# Patient Record
Sex: Female | Born: 1935 | Race: White | Hispanic: No | Marital: Married | Smoking: Never smoker
Health system: Southern US, Community
[De-identification: ages and names within clinical notes are randomized; demographics above are authoritative.]

## PROBLEM LIST (undated history)

## (undated) DIAGNOSIS — E785 Hyperlipidemia, unspecified: Secondary | ICD-10-CM

## (undated) DIAGNOSIS — D649 Anemia, unspecified: Secondary | ICD-10-CM

## (undated) DIAGNOSIS — I4891 Unspecified atrial fibrillation: Secondary | ICD-10-CM

## (undated) DIAGNOSIS — C50919 Malignant neoplasm of unspecified site of unspecified female breast: Secondary | ICD-10-CM

## (undated) DIAGNOSIS — E611 Iron deficiency: Secondary | ICD-10-CM

## (undated) DIAGNOSIS — I219 Acute myocardial infarction, unspecified: Secondary | ICD-10-CM

## (undated) DIAGNOSIS — I10 Essential (primary) hypertension: Secondary | ICD-10-CM

## (undated) DIAGNOSIS — I639 Cerebral infarction, unspecified: Secondary | ICD-10-CM

## (undated) DIAGNOSIS — Z90710 Acquired absence of both cervix and uterus: Secondary | ICD-10-CM

## (undated) DIAGNOSIS — F419 Anxiety disorder, unspecified: Secondary | ICD-10-CM

## (undated) HISTORY — PX: TOTAL KNEE ARTHROPLASTY: SHX125

## (undated) HISTORY — PX: MASTECTOMY: SHX3

## (undated) HISTORY — DX: Acquired absence of both cervix and uterus: Z90.710

## (undated) HISTORY — DX: Essential (primary) hypertension: I10

## (undated) HISTORY — PX: EYE SURGERY: SHX253

## (undated) HISTORY — DX: Acute myocardial infarction, unspecified: I21.9

## (undated) HISTORY — DX: Cerebral infarction, unspecified: I63.9

## (undated) HISTORY — DX: Hyperlipidemia, unspecified: E78.5

## (undated) HISTORY — DX: Anxiety disorder, unspecified: F41.9

## (undated) HISTORY — DX: Malignant neoplasm of unspecified site of unspecified female breast: C50.919

## (undated) HISTORY — PX: CHOLECYSTECTOMY: SHX55

## (undated) HISTORY — DX: Unspecified atrial fibrillation: I48.91

## (undated) HISTORY — DX: Anemia, unspecified: D64.9

## (undated) HISTORY — DX: Iron deficiency: E61.1

---

## 1997-08-17 ENCOUNTER — Other Ambulatory Visit: Admission: RE | Admit: 1997-08-17 | Discharge: 1997-08-17 | Payer: Self-pay | Admitting: Obstetrics and Gynecology

## 1997-12-29 ENCOUNTER — Encounter: Payer: Self-pay | Admitting: Internal Medicine

## 1997-12-29 ENCOUNTER — Ambulatory Visit (HOSPITAL_COMMUNITY): Admission: RE | Admit: 1997-12-29 | Discharge: 1997-12-29 | Payer: Self-pay | Admitting: Internal Medicine

## 1998-10-24 ENCOUNTER — Other Ambulatory Visit: Admission: RE | Admit: 1998-10-24 | Discharge: 1998-10-24 | Payer: Self-pay | Admitting: Obstetrics and Gynecology

## 1999-01-01 ENCOUNTER — Encounter: Admission: RE | Admit: 1999-01-01 | Discharge: 1999-01-01 | Payer: Self-pay | Admitting: Obstetrics and Gynecology

## 1999-01-01 ENCOUNTER — Encounter: Payer: Self-pay | Admitting: Obstetrics and Gynecology

## 1999-01-10 ENCOUNTER — Encounter: Admission: RE | Admit: 1999-01-10 | Discharge: 1999-01-10 | Payer: Self-pay | Admitting: Obstetrics and Gynecology

## 1999-01-10 ENCOUNTER — Encounter: Payer: Self-pay | Admitting: Obstetrics and Gynecology

## 1999-01-15 ENCOUNTER — Ambulatory Visit (HOSPITAL_COMMUNITY): Admission: RE | Admit: 1999-01-15 | Discharge: 1999-01-15 | Payer: Self-pay | Admitting: Obstetrics and Gynecology

## 1999-01-15 ENCOUNTER — Encounter: Payer: Self-pay | Admitting: Obstetrics and Gynecology

## 1999-01-23 ENCOUNTER — Encounter (INDEPENDENT_AMBULATORY_CARE_PROVIDER_SITE_OTHER): Payer: Self-pay | Admitting: Specialist

## 1999-01-23 ENCOUNTER — Ambulatory Visit (HOSPITAL_COMMUNITY): Admission: RE | Admit: 1999-01-23 | Discharge: 1999-01-23 | Payer: Self-pay | Admitting: Internal Medicine

## 1999-01-23 ENCOUNTER — Encounter: Payer: Self-pay | Admitting: Internal Medicine

## 1999-02-15 ENCOUNTER — Encounter: Payer: Self-pay | Admitting: General Surgery

## 1999-02-19 ENCOUNTER — Encounter (INDEPENDENT_AMBULATORY_CARE_PROVIDER_SITE_OTHER): Payer: Self-pay | Admitting: Specialist

## 1999-02-19 ENCOUNTER — Ambulatory Visit (HOSPITAL_COMMUNITY): Admission: RE | Admit: 1999-02-19 | Discharge: 1999-02-20 | Payer: Self-pay | Admitting: General Surgery

## 1999-02-19 ENCOUNTER — Encounter: Payer: Self-pay | Admitting: General Surgery

## 1999-06-26 ENCOUNTER — Encounter: Payer: Self-pay | Admitting: General Surgery

## 1999-06-26 ENCOUNTER — Encounter: Admission: RE | Admit: 1999-06-26 | Discharge: 1999-06-26 | Payer: Self-pay | Admitting: General Surgery

## 1999-09-04 ENCOUNTER — Encounter: Payer: Self-pay | Admitting: General Surgery

## 1999-09-07 ENCOUNTER — Encounter (INDEPENDENT_AMBULATORY_CARE_PROVIDER_SITE_OTHER): Payer: Self-pay | Admitting: Specialist

## 1999-09-07 ENCOUNTER — Ambulatory Visit (HOSPITAL_COMMUNITY): Admission: RE | Admit: 1999-09-07 | Discharge: 1999-09-07 | Payer: Self-pay | Admitting: General Surgery

## 2000-01-09 ENCOUNTER — Other Ambulatory Visit: Admission: RE | Admit: 2000-01-09 | Discharge: 2000-01-09 | Payer: Self-pay | Admitting: Obstetrics and Gynecology

## 2000-06-26 ENCOUNTER — Encounter: Admission: RE | Admit: 2000-06-26 | Discharge: 2000-06-26 | Payer: Self-pay | Admitting: General Surgery

## 2000-06-26 ENCOUNTER — Encounter: Payer: Self-pay | Admitting: General Surgery

## 2001-01-21 ENCOUNTER — Other Ambulatory Visit: Admission: RE | Admit: 2001-01-21 | Discharge: 2001-01-21 | Payer: Self-pay | Admitting: Obstetrics and Gynecology

## 2001-06-30 ENCOUNTER — Encounter: Admission: RE | Admit: 2001-06-30 | Discharge: 2001-06-30 | Payer: Self-pay | Admitting: Oncology

## 2001-06-30 ENCOUNTER — Encounter: Payer: Self-pay | Admitting: Oncology

## 2001-07-21 ENCOUNTER — Encounter: Payer: Self-pay | Admitting: Internal Medicine

## 2001-07-21 ENCOUNTER — Encounter: Admission: RE | Admit: 2001-07-21 | Discharge: 2001-07-21 | Payer: Self-pay | Admitting: Internal Medicine

## 2001-08-24 ENCOUNTER — Encounter: Payer: Self-pay | Admitting: Emergency Medicine

## 2001-08-24 ENCOUNTER — Encounter: Payer: Self-pay | Admitting: Cardiovascular Disease

## 2001-08-24 ENCOUNTER — Inpatient Hospital Stay (HOSPITAL_COMMUNITY): Admission: EM | Admit: 2001-08-24 | Discharge: 2001-08-25 | Payer: Self-pay | Admitting: Emergency Medicine

## 2002-02-02 ENCOUNTER — Encounter: Admission: RE | Admit: 2002-02-02 | Discharge: 2002-02-02 | Payer: Self-pay | Admitting: Internal Medicine

## 2002-02-02 ENCOUNTER — Encounter: Payer: Self-pay | Admitting: Internal Medicine

## 2002-07-05 ENCOUNTER — Encounter: Admission: RE | Admit: 2002-07-05 | Discharge: 2002-07-05 | Payer: Self-pay | Admitting: Oncology

## 2002-07-05 ENCOUNTER — Encounter: Payer: Self-pay | Admitting: Oncology

## 2002-11-29 DIAGNOSIS — K5730 Diverticulosis of large intestine without perforation or abscess without bleeding: Secondary | ICD-10-CM | POA: Insufficient documentation

## 2003-01-25 ENCOUNTER — Other Ambulatory Visit: Admission: RE | Admit: 2003-01-25 | Discharge: 2003-01-25 | Payer: Self-pay | Admitting: Internal Medicine

## 2003-07-06 ENCOUNTER — Encounter: Admission: RE | Admit: 2003-07-06 | Discharge: 2003-07-06 | Payer: Self-pay | Admitting: Oncology

## 2003-08-29 ENCOUNTER — Inpatient Hospital Stay (HOSPITAL_COMMUNITY): Admission: RE | Admit: 2003-08-29 | Discharge: 2003-09-02 | Payer: Self-pay | Admitting: Orthopedic Surgery

## 2004-02-16 ENCOUNTER — Encounter: Admission: RE | Admit: 2004-02-16 | Discharge: 2004-02-16 | Payer: Self-pay | Admitting: Oncology

## 2004-02-22 ENCOUNTER — Ambulatory Visit: Payer: Self-pay | Admitting: Oncology

## 2004-07-10 ENCOUNTER — Encounter: Admission: RE | Admit: 2004-07-10 | Discharge: 2004-07-10 | Payer: Self-pay | Admitting: Oncology

## 2004-08-21 ENCOUNTER — Ambulatory Visit: Payer: Self-pay | Admitting: Oncology

## 2005-01-31 ENCOUNTER — Encounter: Admission: RE | Admit: 2005-01-31 | Discharge: 2005-01-31 | Payer: Self-pay | Admitting: Internal Medicine

## 2005-02-20 ENCOUNTER — Ambulatory Visit: Payer: Self-pay | Admitting: Oncology

## 2005-07-11 ENCOUNTER — Encounter: Admission: RE | Admit: 2005-07-11 | Discharge: 2005-07-11 | Payer: Self-pay | Admitting: Oncology

## 2005-08-27 ENCOUNTER — Ambulatory Visit: Payer: Self-pay | Admitting: Oncology

## 2005-08-27 LAB — CBC WITH DIFFERENTIAL/PLATELET
Eosinophils Absolute: 0.4 10*3/uL (ref 0.0–0.5)
HCT: 35.2 % (ref 34.8–46.6)
LYMPH%: 24.7 % (ref 14.0–48.0)
MONO#: 0.6 10*3/uL (ref 0.1–0.9)
NEUT#: 5.1 10*3/uL (ref 1.5–6.5)
NEUT%: 62.6 % (ref 39.6–76.8)
Platelets: 282 10*3/uL (ref 145–400)
RBC: 3.93 10*6/uL (ref 3.70–5.32)
WBC: 8.2 10*3/uL (ref 3.9–10.0)

## 2005-08-27 LAB — CANCER ANTIGEN 27.29: CA 27.29: 11 U/mL (ref 0–39)

## 2005-08-27 LAB — COMPREHENSIVE METABOLIC PANEL
CO2: 29 mEq/L (ref 19–32)
Calcium: 10.2 mg/dL (ref 8.4–10.5)
Glucose, Bld: 90 mg/dL (ref 70–99)
Sodium: 137 mEq/L (ref 135–145)
Total Bilirubin: 0.6 mg/dL (ref 0.3–1.2)
Total Protein: 7 g/dL (ref 6.0–8.3)

## 2005-08-27 LAB — LACTATE DEHYDROGENASE: LDH: 197 U/L (ref 94–250)

## 2006-02-06 ENCOUNTER — Other Ambulatory Visit: Admission: RE | Admit: 2006-02-06 | Discharge: 2006-02-06 | Payer: Self-pay | Admitting: Internal Medicine

## 2006-02-06 LAB — HM PAP SMEAR

## 2006-02-18 ENCOUNTER — Encounter: Admission: RE | Admit: 2006-02-18 | Discharge: 2006-02-18 | Payer: Self-pay | Admitting: Oncology

## 2006-02-25 ENCOUNTER — Encounter: Admission: RE | Admit: 2006-02-25 | Discharge: 2006-02-25 | Payer: Self-pay | Admitting: Internal Medicine

## 2006-03-13 ENCOUNTER — Encounter: Admission: RE | Admit: 2006-03-13 | Discharge: 2006-04-10 | Payer: Self-pay | Admitting: Internal Medicine

## 2006-04-10 ENCOUNTER — Encounter: Admission: RE | Admit: 2006-04-10 | Discharge: 2006-04-10 | Payer: Self-pay | Admitting: Internal Medicine

## 2006-04-11 ENCOUNTER — Inpatient Hospital Stay (HOSPITAL_COMMUNITY): Admission: EM | Admit: 2006-04-11 | Discharge: 2006-04-14 | Payer: Self-pay | Admitting: Emergency Medicine

## 2006-04-18 ENCOUNTER — Emergency Department (HOSPITAL_COMMUNITY): Admission: EM | Admit: 2006-04-18 | Discharge: 2006-04-18 | Payer: Self-pay | Admitting: Emergency Medicine

## 2006-05-01 ENCOUNTER — Encounter: Admission: RE | Admit: 2006-05-01 | Discharge: 2006-05-01 | Payer: Self-pay | Admitting: Neurosurgery

## 2006-05-16 ENCOUNTER — Encounter: Admission: RE | Admit: 2006-05-16 | Discharge: 2006-05-16 | Payer: Self-pay | Admitting: Neurosurgery

## 2006-07-08 ENCOUNTER — Encounter: Admission: RE | Admit: 2006-07-08 | Discharge: 2006-07-08 | Payer: Self-pay | Admitting: Internal Medicine

## 2006-07-14 ENCOUNTER — Encounter: Admission: RE | Admit: 2006-07-14 | Discharge: 2006-07-14 | Payer: Self-pay | Admitting: Oncology

## 2006-07-16 ENCOUNTER — Ambulatory Visit: Payer: Self-pay | Admitting: Vascular Surgery

## 2006-07-16 ENCOUNTER — Ambulatory Visit (HOSPITAL_COMMUNITY): Admission: RE | Admit: 2006-07-16 | Discharge: 2006-07-16 | Payer: Self-pay | Admitting: Internal Medicine

## 2006-08-13 ENCOUNTER — Ambulatory Visit: Payer: Self-pay | Admitting: Internal Medicine

## 2006-08-14 ENCOUNTER — Ambulatory Visit (HOSPITAL_COMMUNITY): Admission: RE | Admit: 2006-08-14 | Discharge: 2006-08-14 | Payer: Self-pay | Admitting: Neurosurgery

## 2006-08-21 ENCOUNTER — Emergency Department (HOSPITAL_COMMUNITY): Admission: EM | Admit: 2006-08-21 | Discharge: 2006-08-21 | Payer: Self-pay | Admitting: Emergency Medicine

## 2006-08-22 ENCOUNTER — Ambulatory Visit: Payer: Self-pay | Admitting: Oncology

## 2006-08-26 LAB — COMPREHENSIVE METABOLIC PANEL
AST: 15 U/L (ref 0–37)
Alkaline Phosphatase: 67 U/L (ref 39–117)
BUN: 12 mg/dL (ref 6–23)
Calcium: 9.5 mg/dL (ref 8.4–10.5)
Chloride: 101 mEq/L (ref 96–112)
Creatinine, Ser: 0.74 mg/dL (ref 0.40–1.20)
Total Bilirubin: 0.5 mg/dL (ref 0.3–1.2)

## 2006-08-26 LAB — CBC WITH DIFFERENTIAL/PLATELET
Basophils Absolute: 0.1 10*3/uL (ref 0.0–0.1)
EOS%: 3.1 % (ref 0.0–7.0)
HCT: 28.8 % — ABNORMAL LOW (ref 34.8–46.6)
HGB: 9.5 g/dL — ABNORMAL LOW (ref 11.6–15.9)
LYMPH%: 19.8 % (ref 14.0–48.0)
MCH: 23.5 pg — ABNORMAL LOW (ref 26.0–34.0)
MCHC: 32.9 g/dL (ref 32.0–36.0)
MCV: 71.4 fL — ABNORMAL LOW (ref 81.0–101.0)
MONO%: 8.1 % (ref 0.0–13.0)
NEUT%: 68.4 % (ref 39.6–76.8)
Platelets: 410 10*3/uL — ABNORMAL HIGH (ref 145–400)

## 2006-08-29 ENCOUNTER — Encounter (INDEPENDENT_AMBULATORY_CARE_PROVIDER_SITE_OTHER): Payer: Self-pay | Admitting: Emergency Medicine

## 2006-08-29 ENCOUNTER — Inpatient Hospital Stay (HOSPITAL_COMMUNITY): Admission: EM | Admit: 2006-08-29 | Discharge: 2006-09-03 | Payer: Self-pay | Admitting: Emergency Medicine

## 2006-09-02 ENCOUNTER — Encounter: Payer: Self-pay | Admitting: Gastroenterology

## 2006-09-02 DIAGNOSIS — D126 Benign neoplasm of colon, unspecified: Secondary | ICD-10-CM | POA: Insufficient documentation

## 2006-09-05 ENCOUNTER — Ambulatory Visit: Payer: Self-pay | Admitting: Internal Medicine

## 2006-09-26 ENCOUNTER — Ambulatory Visit: Payer: Self-pay | Admitting: Internal Medicine

## 2006-09-26 LAB — CONVERTED CEMR LAB
Eosinophils Absolute: 0.4 10*3/uL (ref 0.0–0.6)
Iron: 19 ug/dL — ABNORMAL LOW (ref 42–145)
Lymphocytes Relative: 19.4 % (ref 12.0–46.0)
MCV: 72.8 fL — ABNORMAL LOW (ref 78.0–100.0)
Monocytes Relative: 8.2 % (ref 3.0–11.0)
Neutro Abs: 5.2 10*3/uL (ref 1.4–7.7)
Platelets: 394 10*3/uL (ref 150–400)
Transferrin: 214.9 mg/dL (ref >=212.0)

## 2006-10-16 ENCOUNTER — Encounter (HOSPITAL_COMMUNITY): Admission: RE | Admit: 2006-10-16 | Discharge: 2007-01-14 | Payer: Self-pay | Admitting: Internal Medicine

## 2006-12-03 ENCOUNTER — Ambulatory Visit: Payer: Self-pay | Admitting: Internal Medicine

## 2006-12-03 LAB — CONVERTED CEMR LAB
Basophils Absolute: 0.1 10*3/uL (ref 0.0–0.1)
Eosinophils Absolute: 0.2 10*3/uL (ref 0.0–0.6)
Hemoglobin: 10.5 g/dL — ABNORMAL LOW (ref 12.0–15.0)
Iron: 23 ug/dL — ABNORMAL LOW (ref 42–145)
MCHC: 32.9 g/dL (ref 30.0–36.0)
MCV: 75.9 fL — ABNORMAL LOW (ref 78.0–100.0)
Monocytes Absolute: 0.4 10*3/uL (ref 0.2–0.7)
Monocytes Relative: 4.9 % (ref 3.0–11.0)
RBC: 4.22 M/uL (ref 3.87–5.11)
RDW: 19.8 % — ABNORMAL HIGH (ref 11.5–14.6)

## 2006-12-12 ENCOUNTER — Ambulatory Visit (HOSPITAL_COMMUNITY): Admission: RE | Admit: 2006-12-12 | Discharge: 2006-12-12 | Payer: Self-pay | Admitting: Internal Medicine

## 2006-12-12 ENCOUNTER — Encounter: Payer: Self-pay | Admitting: Internal Medicine

## 2006-12-17 ENCOUNTER — Ambulatory Visit: Payer: Self-pay | Admitting: Internal Medicine

## 2007-01-05 ENCOUNTER — Ambulatory Visit: Payer: Self-pay | Admitting: Internal Medicine

## 2007-01-05 LAB — CONVERTED CEMR LAB
Basophils Relative: 0.1 % (ref 0.0–1.0)
HCT: 31.1 % — ABNORMAL LOW (ref 36.0–46.0)
Hemoglobin: 10.4 g/dL — ABNORMAL LOW (ref 12.0–15.0)
Lymphocytes Relative: 16.1 % (ref 12.0–46.0)
MCHC: 33.4 g/dL (ref 30.0–36.0)
Monocytes Absolute: 0.6 10*3/uL (ref 0.2–0.7)
Neutrophils Relative %: 73.3 % (ref 43.0–77.0)
RDW: 20.6 % — ABNORMAL HIGH (ref 11.5–14.6)
WBC: 7.2 10*3/uL (ref 4.5–10.5)

## 2007-01-06 ENCOUNTER — Ambulatory Visit: Payer: Self-pay | Admitting: Oncology

## 2007-01-12 LAB — CBC & DIFF AND RETIC
BASO%: 0.4 % (ref 0.0–2.0)
EOS%: 2.6 % (ref 0.0–7.0)
HCT: 29.8 % — ABNORMAL LOW (ref 34.8–46.6)
LYMPH%: 14.7 % (ref 14.0–48.0)
MCH: 27 pg (ref 26.0–34.0)
MCHC: 34 g/dL (ref 32.0–36.0)
MONO%: 7 % (ref 0.0–13.0)
NEUT%: 75.3 % (ref 39.6–76.8)
Platelets: 294 10*3/uL (ref 145–400)

## 2007-01-12 LAB — MORPHOLOGY

## 2007-01-12 LAB — CHCC SMEAR

## 2007-01-15 LAB — PROTEIN ELECTROPHORESIS, SERUM: Total Protein, Serum Electrophoresis: 7.6 g/dL (ref 6.0–8.3)

## 2007-01-15 LAB — FERRITIN: Ferritin: 1361 ng/mL — ABNORMAL HIGH (ref 10–291)

## 2007-01-15 LAB — VITAMIN B12: Vitamin B-12: 802 pg/mL (ref 211–911)

## 2007-01-15 LAB — IRON AND TIBC: %SAT: 7 % — ABNORMAL LOW (ref 20–55)

## 2007-01-22 ENCOUNTER — Ambulatory Visit: Payer: Self-pay | Admitting: Internal Medicine

## 2007-01-27 LAB — CBC WITH DIFFERENTIAL/PLATELET
BASO%: 0.5 % (ref 0.0–2.0)
Eosinophils Absolute: 0.2 10*3/uL (ref 0.0–0.5)
MCHC: 33.4 g/dL (ref 32.0–36.0)
MCV: 80.4 fL — ABNORMAL LOW (ref 81.0–101.0)
MONO%: 7.2 % (ref 0.0–13.0)
NEUT#: 5.6 10*3/uL (ref 1.5–6.5)
RBC: 3.64 10*6/uL — ABNORMAL LOW (ref 3.70–5.32)
RDW: 18.8 % — ABNORMAL HIGH (ref 11.3–14.5)
WBC: 7.5 10*3/uL (ref 3.9–10.0)

## 2007-01-27 LAB — CHCC SMEAR

## 2007-01-28 ENCOUNTER — Ambulatory Visit (HOSPITAL_COMMUNITY): Admission: RE | Admit: 2007-01-28 | Discharge: 2007-01-28 | Payer: Self-pay | Admitting: Oncology

## 2007-02-09 DIAGNOSIS — I10 Essential (primary) hypertension: Secondary | ICD-10-CM

## 2007-02-09 DIAGNOSIS — D649 Anemia, unspecified: Secondary | ICD-10-CM

## 2007-02-09 DIAGNOSIS — I480 Paroxysmal atrial fibrillation: Secondary | ICD-10-CM | POA: Insufficient documentation

## 2007-02-09 DIAGNOSIS — F411 Generalized anxiety disorder: Secondary | ICD-10-CM | POA: Insufficient documentation

## 2007-02-09 DIAGNOSIS — Z853 Personal history of malignant neoplasm of breast: Secondary | ICD-10-CM

## 2007-02-09 DIAGNOSIS — Z8673 Personal history of transient ischemic attack (TIA), and cerebral infarction without residual deficits: Secondary | ICD-10-CM | POA: Insufficient documentation

## 2007-02-16 ENCOUNTER — Ambulatory Visit: Payer: Self-pay | Admitting: Oncology

## 2007-02-17 ENCOUNTER — Ambulatory Visit (HOSPITAL_COMMUNITY): Admission: RE | Admit: 2007-02-17 | Discharge: 2007-02-17 | Payer: Self-pay | Admitting: Oncology

## 2007-02-18 LAB — CBC & DIFF AND RETIC
BASO%: 1.3 % (ref 0.0–2.0)
EOS%: 3.8 % (ref 0.0–7.0)
HCT: 32.1 % — ABNORMAL LOW (ref 34.8–46.6)
IRF: 0.2 (ref 0.130–0.330)
MCHC: 33 g/dL (ref 32.0–36.0)
MONO#: 0.5 10*3/uL (ref 0.1–0.9)
NEUT%: 76.4 % (ref 39.6–76.8)
RBC: 3.96 10*6/uL (ref 3.70–5.32)
RDW: 17.4 % — ABNORMAL HIGH (ref 11.3–14.5)
RETIC #: 40.8 10*3/uL (ref 19.7–115.1)
Retic %: 1 % (ref 0.4–2.3)
WBC: 7.4 10*3/uL (ref 3.9–10.0)
lymph#: 0.8 10*3/uL — ABNORMAL LOW (ref 0.9–3.3)

## 2007-02-18 LAB — MORPHOLOGY: RBC Comments: NORMAL

## 2007-03-03 ENCOUNTER — Ambulatory Visit: Payer: Self-pay | Admitting: Internal Medicine

## 2007-03-03 LAB — CONVERTED CEMR LAB
ALT: 23 units/L (ref 0–35)
Basophils Relative: 0.7 % (ref 0.0–1.0)
Bilirubin, Direct: 0.1 mg/dL (ref 0.0–0.3)
CA 125: 12.9 units/mL (ref 0.0–30.2)
CO2: 26 meq/L (ref 19–32)
Calcium: 10.2 mg/dL (ref 8.4–10.5)
Eosinophils Relative: 0.9 % (ref 0.0–5.0)
GFR calc Af Amer: 77 mL/min
Glucose, Bld: 81 mg/dL (ref 70–99)
HCT: 37.5 % (ref 36.0–46.0)
Hemoglobin: 12.8 g/dL (ref 12.0–15.0)
Lymphocytes Relative: 6.6 % — ABNORMAL LOW (ref 12.0–46.0)
Monocytes Absolute: 0.8 10*3/uL — ABNORMAL HIGH (ref 0.2–0.7)
Neutro Abs: 11.6 10*3/uL — ABNORMAL HIGH (ref 1.4–7.7)
Neutrophils Relative %: 86 % — ABNORMAL HIGH (ref 43.0–77.0)
Potassium: 5.1 meq/L (ref 3.5–5.1)
Saturation Ratios: 8.7 % — ABNORMAL LOW (ref 20.0–50.0)
Sodium: 133 meq/L — ABNORMAL LOW (ref 135–145)
Total Protein: 7.7 g/dL (ref 6.0–8.3)
WBC: 13.5 10*3/uL — ABNORMAL HIGH (ref 4.5–10.5)

## 2007-03-11 LAB — CBC WITH DIFFERENTIAL/PLATELET
EOS%: 1.2 % (ref 0.0–7.0)
HGB: 12.9 g/dL (ref 11.6–15.9)
MCH: 25.6 pg — ABNORMAL LOW (ref 26.0–34.0)
MCV: 79.6 fL — ABNORMAL LOW (ref 81.0–101.0)
MONO%: 8.5 % (ref 0.0–13.0)
NEUT#: 7.7 10*3/uL — ABNORMAL HIGH (ref 1.5–6.5)
RBC: 5.06 10*6/uL (ref 3.70–5.32)
RDW: 18.1 % — ABNORMAL HIGH (ref 11.3–14.5)
lymph#: 1.1 10*3/uL (ref 0.9–3.3)

## 2007-03-19 ENCOUNTER — Ambulatory Visit: Payer: Self-pay | Admitting: Oncology

## 2007-03-19 ENCOUNTER — Inpatient Hospital Stay (HOSPITAL_COMMUNITY): Admission: EM | Admit: 2007-03-19 | Discharge: 2007-03-27 | Payer: Self-pay | Admitting: Emergency Medicine

## 2007-03-24 ENCOUNTER — Encounter (INDEPENDENT_AMBULATORY_CARE_PROVIDER_SITE_OTHER): Payer: Self-pay | Admitting: *Deleted

## 2007-03-26 ENCOUNTER — Ambulatory Visit: Payer: Self-pay | Admitting: Physical Medicine & Rehabilitation

## 2007-03-27 ENCOUNTER — Ambulatory Visit: Payer: Self-pay | Admitting: Physical Medicine & Rehabilitation

## 2007-03-27 ENCOUNTER — Inpatient Hospital Stay (HOSPITAL_COMMUNITY)
Admission: AD | Admit: 2007-03-27 | Discharge: 2007-04-02 | Payer: Self-pay | Admitting: Physical Medicine & Rehabilitation

## 2007-04-20 ENCOUNTER — Ambulatory Visit: Payer: Self-pay | Admitting: Oncology

## 2007-04-22 LAB — CBC WITH DIFFERENTIAL/PLATELET
Eosinophils Absolute: 0.3 10*3/uL (ref 0.0–0.5)
MONO#: 0.6 10*3/uL (ref 0.1–0.9)
NEUT#: 5.2 10*3/uL (ref 1.5–6.5)
RBC: 4.24 10*6/uL (ref 3.70–5.32)
RDW: 20.6 % — ABNORMAL HIGH (ref 11.3–14.5)
WBC: 7.3 10*3/uL (ref 3.9–10.0)
lymph#: 1.2 10*3/uL (ref 0.9–3.3)

## 2007-05-13 LAB — CBC WITH DIFFERENTIAL/PLATELET
Basophils Absolute: 0.1 10*3/uL (ref 0.0–0.1)
Eosinophils Absolute: 0.1 10*3/uL (ref 0.0–0.5)
HCT: 44.9 % (ref 34.8–46.6)
HGB: 14.5 g/dL (ref 11.6–15.9)
MCH: 27 pg (ref 26.0–34.0)
MONO#: 0.8 10*3/uL (ref 0.1–0.9)
NEUT#: 8.1 10*3/uL — ABNORMAL HIGH (ref 1.5–6.5)
NEUT%: 71.4 % (ref 39.6–76.8)
RDW: 19.4 % — ABNORMAL HIGH (ref 11.3–14.5)
lymph#: 2.2 10*3/uL (ref 0.9–3.3)

## 2007-06-02 ENCOUNTER — Ambulatory Visit: Payer: Self-pay | Admitting: Oncology

## 2007-06-04 LAB — MORPHOLOGY: PLT EST: ADEQUATE

## 2007-06-04 LAB — CBC & DIFF AND RETIC
Eosinophils Absolute: 0.3 10*3/uL (ref 0.0–0.5)
IRF: 0.31 (ref 0.130–0.330)
MCV: 81.4 fL (ref 81.0–101.0)
MONO%: 8.4 % (ref 0.0–13.0)
NEUT#: 5.8 10*3/uL (ref 1.5–6.5)
RBC: 4.03 10*6/uL (ref 3.70–5.32)
RDW: 19.2 % — ABNORMAL HIGH (ref 11.3–14.5)
RETIC #: 49.6 10*3/uL (ref 19.7–115.1)
Retic %: 1.2 % (ref 0.4–2.3)
WBC: 7.9 10*3/uL (ref 3.9–10.0)
lymph#: 1.1 10*3/uL (ref 0.9–3.3)

## 2007-06-04 LAB — COMPREHENSIVE METABOLIC PANEL
AST: 19 U/L (ref 0–37)
Alkaline Phosphatase: 77 U/L (ref 39–117)
BUN: 13 mg/dL (ref 6–23)
Creatinine, Ser: 0.81 mg/dL (ref 0.40–1.20)
Potassium: 3.9 mEq/L (ref 3.5–5.3)

## 2007-06-04 LAB — CHCC SMEAR

## 2007-06-04 LAB — IRON AND TIBC
%SAT: 7 % — ABNORMAL LOW (ref 20–55)
TIBC: 253 ug/dL (ref 250–470)

## 2007-06-24 LAB — CBC WITH DIFFERENTIAL/PLATELET
BASO%: 1.3 % (ref 0.0–2.0)
EOS%: 5.6 % (ref 0.0–7.0)
HCT: 38.6 % (ref 34.8–46.6)
HGB: 12.5 g/dL (ref 11.6–15.9)
MCH: 27.5 pg (ref 26.0–34.0)
MCHC: 32.4 g/dL (ref 32.0–36.0)
MONO#: 0.6 10*3/uL (ref 0.1–0.9)
NEUT%: 68 % (ref 39.6–76.8)
RDW: 16 % — ABNORMAL HIGH (ref 11.3–14.5)
WBC: 8.2 10*3/uL (ref 3.9–10.0)
lymph#: 1.5 10*3/uL (ref 0.9–3.3)

## 2007-07-01 ENCOUNTER — Encounter: Payer: Self-pay | Admitting: Internal Medicine

## 2007-07-20 ENCOUNTER — Encounter: Admission: RE | Admit: 2007-07-20 | Discharge: 2007-07-20 | Payer: Self-pay | Admitting: Internal Medicine

## 2007-08-03 ENCOUNTER — Ambulatory Visit: Payer: Self-pay | Admitting: Oncology

## 2007-08-05 LAB — CBC WITH DIFFERENTIAL/PLATELET
BASO%: 1 % (ref 0.0–2.0)
EOS%: 4.3 % (ref 0.0–7.0)
MCH: 27.8 pg (ref 26.0–34.0)
MCHC: 33 g/dL (ref 32.0–36.0)
MONO#: 0.9 10*3/uL (ref 0.1–0.9)
NEUT%: 65.3 % (ref 39.6–76.8)
RBC: 4.55 10*6/uL (ref 3.70–5.32)
WBC: 9.6 10*3/uL (ref 3.9–10.0)
lymph#: 1.9 10*3/uL (ref 0.9–3.3)

## 2007-09-02 LAB — CBC WITH DIFFERENTIAL/PLATELET
BASO%: 1.4 % (ref 0.0–2.0)
EOS%: 6.6 % (ref 0.0–7.0)
HCT: 37.1 % (ref 34.8–46.6)
LYMPH%: 16.6 % (ref 14.0–48.0)
MCH: 27.6 pg (ref 26.0–34.0)
MCHC: 33.4 g/dL (ref 32.0–36.0)
MCV: 82.5 fL (ref 81.0–101.0)
MONO%: 9.1 % (ref 0.0–13.0)
NEUT%: 66.3 % (ref 39.6–76.8)
Platelets: 256 10*3/uL (ref 145–400)
RBC: 4.5 10*6/uL (ref 3.70–5.32)
WBC: 8.8 10*3/uL (ref 3.9–10.0)

## 2007-09-27 ENCOUNTER — Ambulatory Visit: Payer: Self-pay | Admitting: Oncology

## 2007-09-30 LAB — CBC WITH DIFFERENTIAL/PLATELET
Basophils Absolute: 0.1 10*3/uL (ref 0.0–0.1)
Eosinophils Absolute: 0.3 10*3/uL (ref 0.0–0.5)
HCT: 36.4 % (ref 34.8–46.6)
LYMPH%: 21.6 % (ref 14.0–48.0)
MCV: 82.8 fL (ref 81.0–101.0)
MONO%: 11.1 % (ref 0.0–13.0)
NEUT#: 5 10*3/uL (ref 1.5–6.5)
NEUT%: 61.9 % (ref 39.6–76.8)
Platelets: 271 10*3/uL (ref 145–400)
RBC: 4.39 10*6/uL (ref 3.70–5.32)

## 2007-10-28 LAB — CBC WITH DIFFERENTIAL/PLATELET
Basophils Absolute: 0.1 10*3/uL (ref 0.0–0.1)
Eosinophils Absolute: 0.2 10*3/uL (ref 0.0–0.5)
HGB: 12.7 g/dL (ref 11.6–15.9)
MCV: 84.1 fL (ref 81.0–101.0)
MONO#: 0.9 10*3/uL (ref 0.1–0.9)
MONO%: 9.7 % (ref 0.0–13.0)
NEUT#: 5.9 10*3/uL (ref 1.5–6.5)
Platelets: 374 10*3/uL (ref 145–400)
RDW: 15 % — ABNORMAL HIGH (ref 11.3–14.5)

## 2007-11-23 ENCOUNTER — Ambulatory Visit: Payer: Self-pay | Admitting: Oncology

## 2007-11-25 LAB — CBC WITH DIFFERENTIAL/PLATELET
BASO%: 1.4 % (ref 0.0–2.0)
LYMPH%: 18.1 % (ref 14.0–48.0)
MCHC: 34.2 g/dL (ref 32.0–36.0)
MONO#: 0.6 10*3/uL (ref 0.1–0.9)
NEUT#: 4.6 10*3/uL (ref 1.5–6.5)
Platelets: 372 10*3/uL (ref 145–400)
RBC: 3.95 10*6/uL (ref 3.70–5.32)
RDW: 14.9 % — ABNORMAL HIGH (ref 11.3–14.5)
WBC: 6.7 10*3/uL (ref 3.9–10.0)

## 2007-12-23 LAB — CBC WITH DIFFERENTIAL/PLATELET
Basophils Absolute: 0.2 10*3/uL — ABNORMAL HIGH (ref 0.0–0.1)
EOS%: 3.1 % (ref 0.0–7.0)
Eosinophils Absolute: 0.2 10*3/uL (ref 0.0–0.5)
HCT: 39.7 % (ref 34.8–46.6)
HGB: 13.2 g/dL (ref 11.6–15.9)
MONO#: 0.6 10*3/uL (ref 0.1–0.9)
NEUT#: 4.4 10*3/uL (ref 1.5–6.5)
NEUT%: 64.3 % (ref 39.6–76.8)
RDW: 14.6 % — ABNORMAL HIGH (ref 11.3–14.5)
WBC: 6.9 10*3/uL (ref 3.9–10.0)
lymph#: 1.5 10*3/uL (ref 0.9–3.3)

## 2007-12-29 ENCOUNTER — Encounter: Payer: Self-pay | Admitting: Internal Medicine

## 2007-12-29 LAB — CBC & DIFF AND RETIC
Basophils Absolute: 0 10*3/uL (ref 0.0–0.1)
Eosinophils Absolute: 0.2 10*3/uL (ref 0.0–0.5)
HGB: 12.8 g/dL (ref 11.6–15.9)
LYMPH%: 25 % (ref 14.0–48.0)
MCV: 86.6 fL (ref 81.0–101.0)
MONO#: 0.5 10*3/uL (ref 0.1–0.9)
MONO%: 8.2 % (ref 0.0–13.0)
NEUT#: 4.1 10*3/uL (ref 1.5–6.5)
Platelets: 349 10*3/uL (ref 145–400)
RBC: 4.4 10*6/uL (ref 3.70–5.32)
RETIC #: 43.1 10*3/uL (ref 19.7–115.1)
Retic %: 1 % (ref 0.4–2.3)
WBC: 6.5 10*3/uL (ref 3.9–10.0)

## 2007-12-29 LAB — COMPREHENSIVE METABOLIC PANEL
AST: 18 U/L (ref 0–37)
Albumin: 4.1 g/dL (ref 3.5–5.2)
Alkaline Phosphatase: 75 U/L (ref 39–117)
Calcium: 9.9 mg/dL (ref 8.4–10.5)
Chloride: 103 mEq/L (ref 96–112)
Glucose, Bld: 66 mg/dL — ABNORMAL LOW (ref 70–99)
Potassium: 4.4 mEq/L (ref 3.5–5.3)
Sodium: 138 mEq/L (ref 135–145)
Total Protein: 6.7 g/dL (ref 6.0–8.3)

## 2007-12-29 LAB — IRON AND TIBC
%SAT: 12 % — ABNORMAL LOW (ref 20–55)
TIBC: 285 ug/dL (ref 250–470)

## 2007-12-29 LAB — FOLATE: Folate: >20 ng/mL

## 2007-12-29 LAB — FERRITIN: Ferritin: 305 ng/mL — ABNORMAL HIGH (ref 10–291)

## 2008-01-18 ENCOUNTER — Ambulatory Visit: Payer: Self-pay | Admitting: Internal Medicine

## 2008-06-13 ENCOUNTER — Ambulatory Visit: Payer: Self-pay | Admitting: Internal Medicine

## 2008-06-27 ENCOUNTER — Ambulatory Visit: Payer: Self-pay | Admitting: Oncology

## 2008-06-29 LAB — CBC WITH DIFFERENTIAL/PLATELET
Basophils Absolute: 0 10*3/uL (ref 0.0–0.1)
Eosinophils Absolute: 0.3 10*3/uL (ref 0.0–0.5)
HGB: 11.7 g/dL (ref 11.6–15.9)
LYMPH%: 18.6 % (ref 14.0–49.7)
MCV: 85.1 fL (ref 79.5–101.0)
MONO#: 0.4 10*3/uL (ref 0.1–0.9)
MONO%: 6.1 % (ref 0.0–14.0)
NEUT#: 4.8 10*3/uL (ref 1.5–6.5)
Platelets: 376 10*3/uL (ref 145–400)
RDW: 14.2 % (ref 11.2–14.5)
WBC: 6.8 10*3/uL (ref 3.9–10.3)

## 2008-06-29 LAB — COMPREHENSIVE METABOLIC PANEL
ALT: 15 U/L (ref 0–35)
AST: 19 U/L (ref 0–37)
CO2: 29 mEq/L (ref 19–32)
Creatinine, Ser: 0.87 mg/dL (ref 0.40–1.20)
Sodium: 139 mEq/L (ref 135–145)
Total Bilirubin: 0.3 mg/dL (ref 0.3–1.2)
Total Protein: 7.2 g/dL (ref 6.0–8.3)

## 2008-06-29 LAB — LACTATE DEHYDROGENASE: LDH: 147 U/L (ref 94–250)

## 2008-06-29 LAB — IRON AND TIBC
%SAT: 7 % — ABNORMAL LOW (ref 20–55)
Iron: 22 ug/dL — ABNORMAL LOW (ref 42–145)

## 2008-06-29 LAB — CHCC SMEAR

## 2008-06-29 LAB — MORPHOLOGY: PLT EST: ADEQUATE

## 2008-06-29 LAB — FERRITIN: Ferritin: 276 ng/mL (ref 10–291)

## 2008-08-03 ENCOUNTER — Encounter: Admission: RE | Admit: 2008-08-03 | Discharge: 2008-08-03 | Payer: Self-pay | Admitting: Internal Medicine

## 2008-11-01 ENCOUNTER — Ambulatory Visit: Payer: Self-pay | Admitting: Internal Medicine

## 2009-01-26 ENCOUNTER — Ambulatory Visit: Payer: Self-pay | Admitting: Internal Medicine

## 2009-03-09 ENCOUNTER — Ambulatory Visit: Payer: Self-pay | Admitting: Internal Medicine

## 2009-05-17 ENCOUNTER — Ambulatory Visit: Payer: Self-pay | Admitting: Cardiology

## 2009-05-17 ENCOUNTER — Inpatient Hospital Stay (HOSPITAL_COMMUNITY): Admission: EM | Admit: 2009-05-17 | Discharge: 2009-05-19 | Payer: Self-pay | Admitting: Emergency Medicine

## 2009-05-26 IMAGING — CT CT HEAD W/O CM
1 of 2 series · 15 of 30 positions shown, 19 images · IV contrast (agent unspecified)
Comparison: 04/12/06.

CLINICAL DATA: Fall.  Head trauma.  Altered mental status.  Facial trauma and epistaxis.  

 HEAD CT WITHOUT CONTRAST:
TECHNIQUE: Contiguous axial images were obtained from the base of the skull through the vertex according to standard protocol without contrast.
TECHNIQUE: Axial and coronal CT imaging was performed through the maxillofacial structures.  No intravenous contrast was administered.

[Series 4: orbit 2.0 h32s · axial · 0.29mm/px · z∈[-227,-77]mm · 15 of 83 slices shown, 19 images]
[im 4/83  brain]
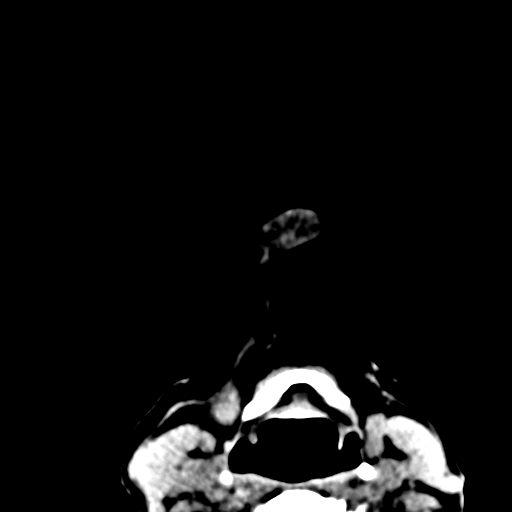
[im 4/83  bone]
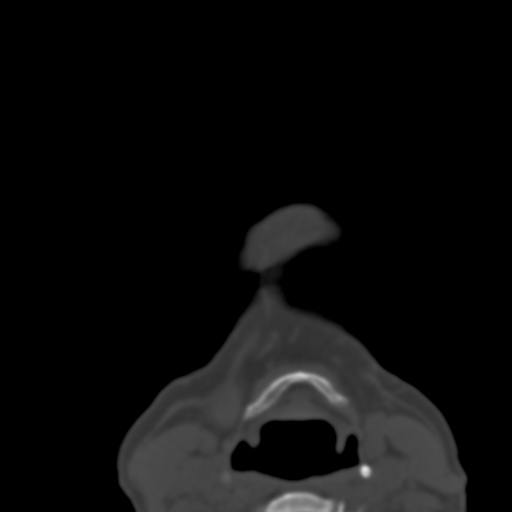
[im 12/83  brain]
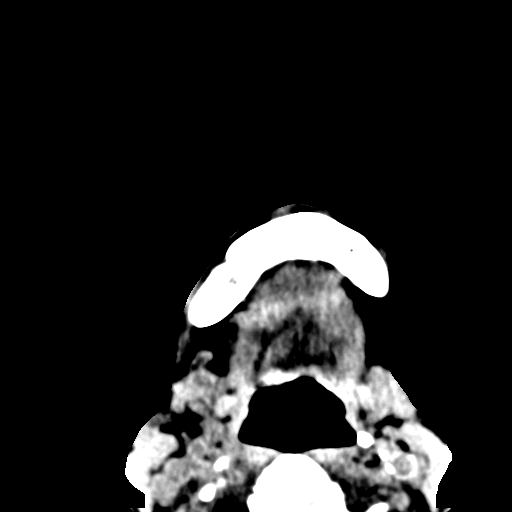
[im 16/83  brain]
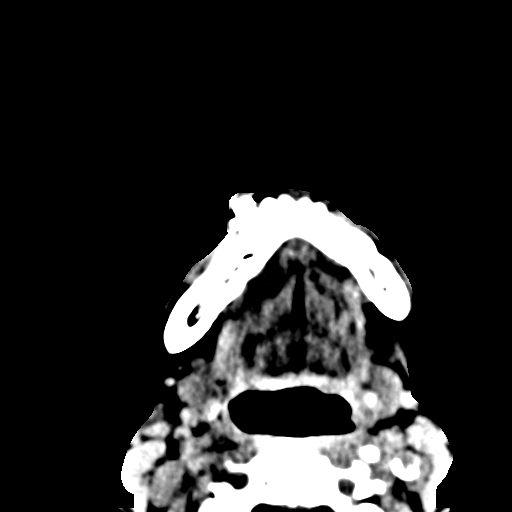
[im 20/83  brain]
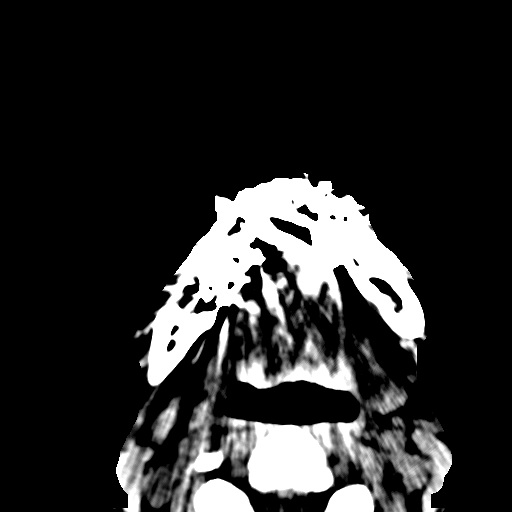
[im 28/83  brain]
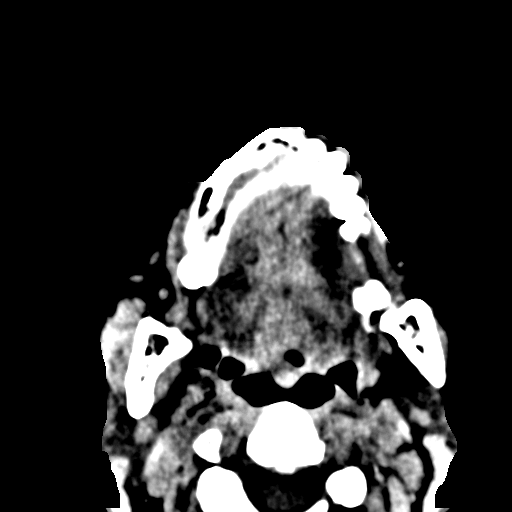
[im 28/83  bone]
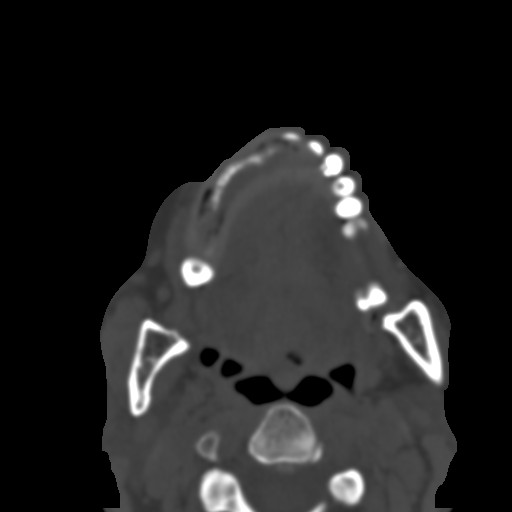
[im 32/83  brain]
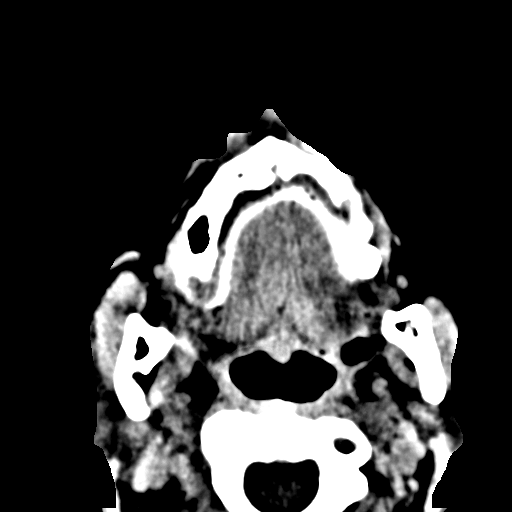
[im 36/83  brain]
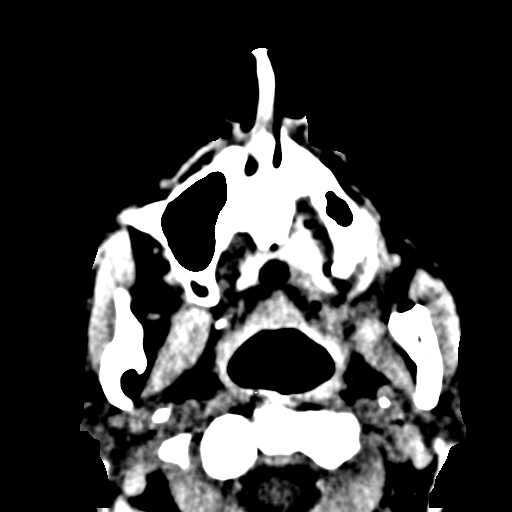
[im 43/83  brain]
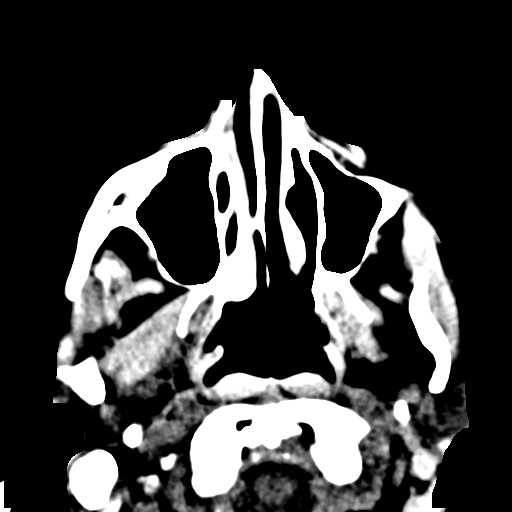
[im 47/83  brain]
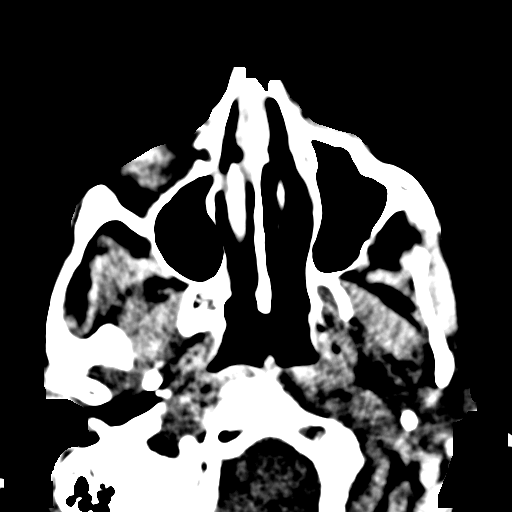
[im 47/83  bone]
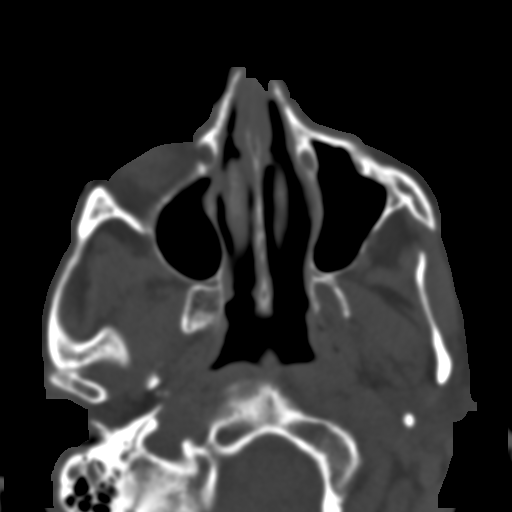
[im 51/83  brain]
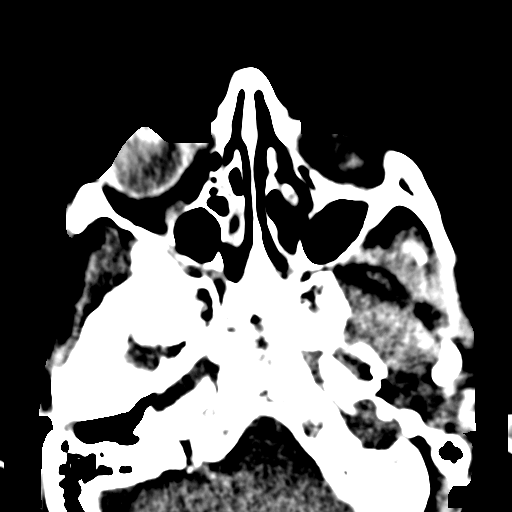
[im 59/83  brain]
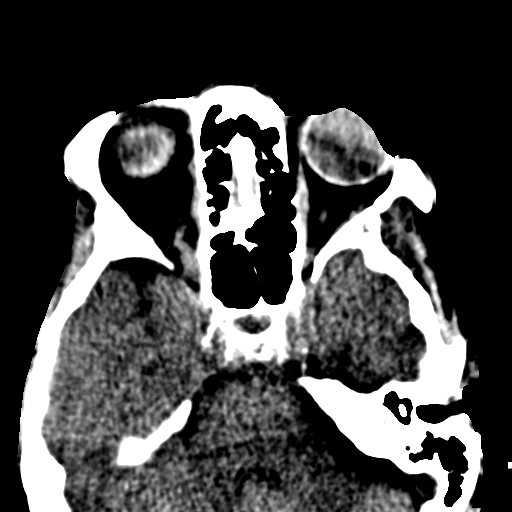
[im 63/83  brain]
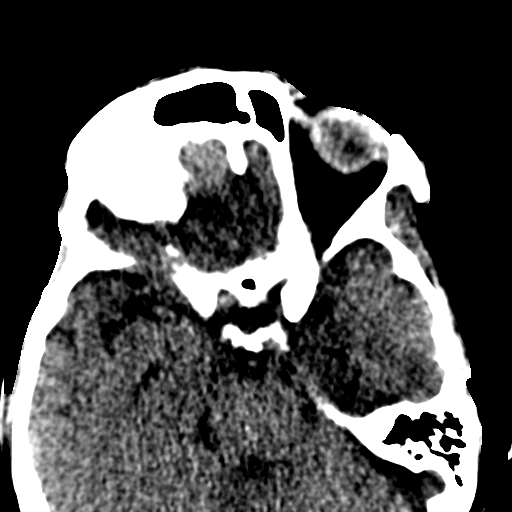
[im 67/83  brain]
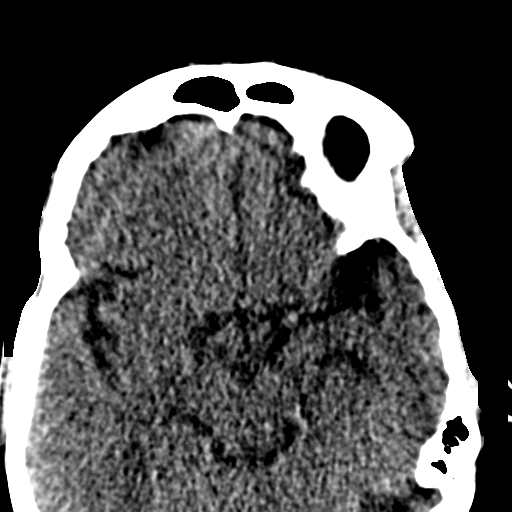
[im 67/83  bone]
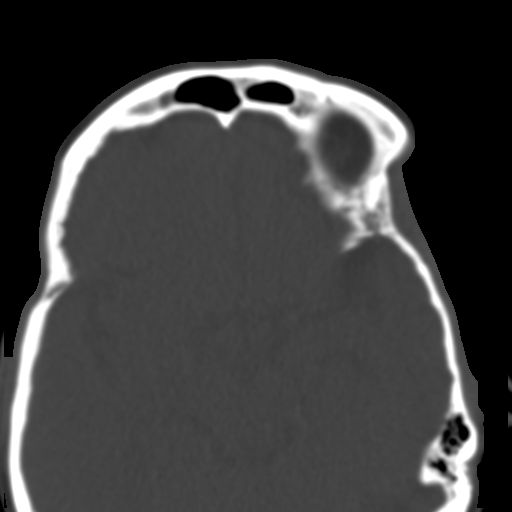
[im 75/83  brain]
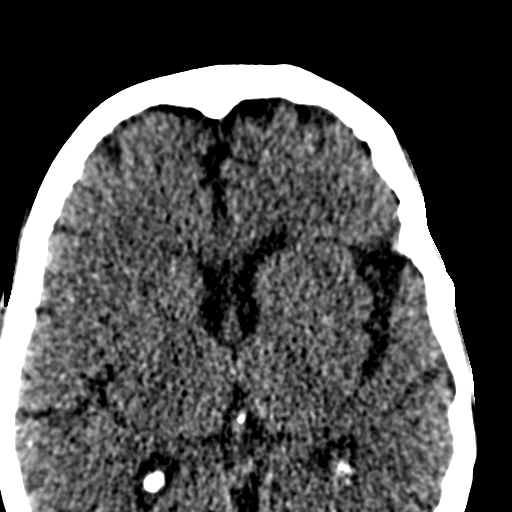
[im 79/83  brain]
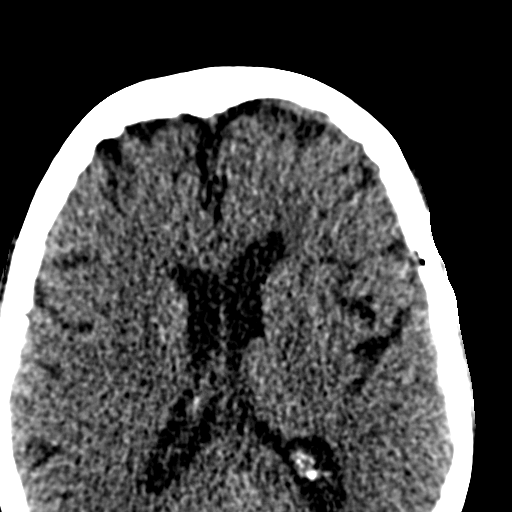

[15 of 30 positions shown; findings below may reference images not displayed]

FINDINGS: There is no evidence of intracranial hemorrhage, brain edema, or other signs of acute infarct.  There is no evidence of intracranial mass or mass effect.  No abnormal extraaxial fluid collections are identified.  Mild cerebral atrophy is stable.  There is no evidence of skull fracture.
IMPRESSION: 1.  No acute intracranial findings. 

 2.  Stable mild cerebral atrophy. 

 MAXILLOFACIAL CT WITHOUT CONTRAST:
FINDINGS: There is no evidence of orbital or facial bone fracture.  The globes and other intraorbital structures are unremarkable in appearance.  There is no evidence of orbital emphysema or sinus air fluid levels.  Mild mucosal thickening is seen involving the ethmoid sinuses bilaterally.
IMPRESSION: 1.  No acute findings.
 2.  Mild chronic ethmoid sinusitis.

## 2009-05-26 IMAGING — CR DG CHEST 2V
2 series · 2 of 2 positions shown · non-contrast
Comparison: 08/29/06.

CLINICAL DATA: Mental status changes with difficulty speaking. 
 CHEST - 2 VIEW:

[w chest lat]
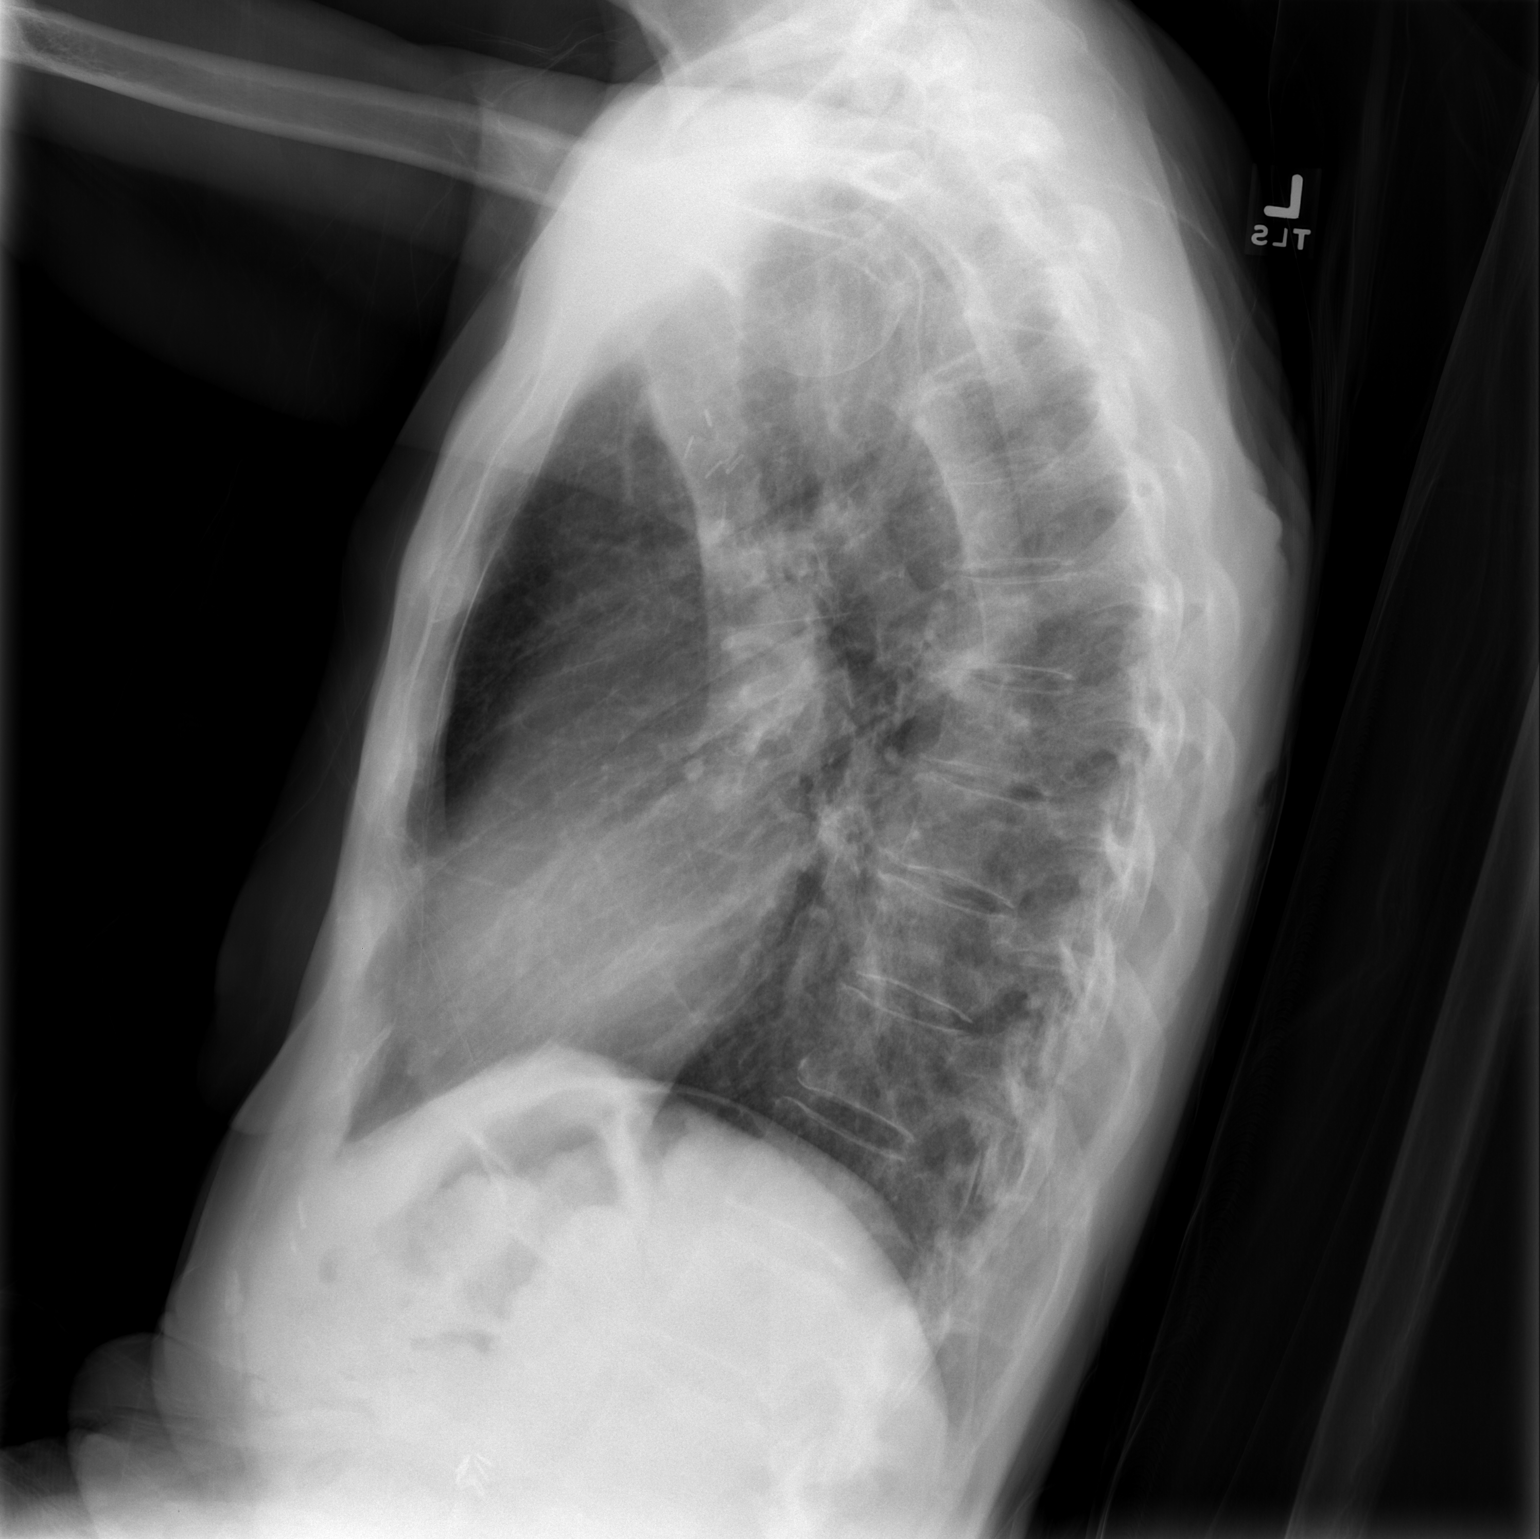

[view not recorded]
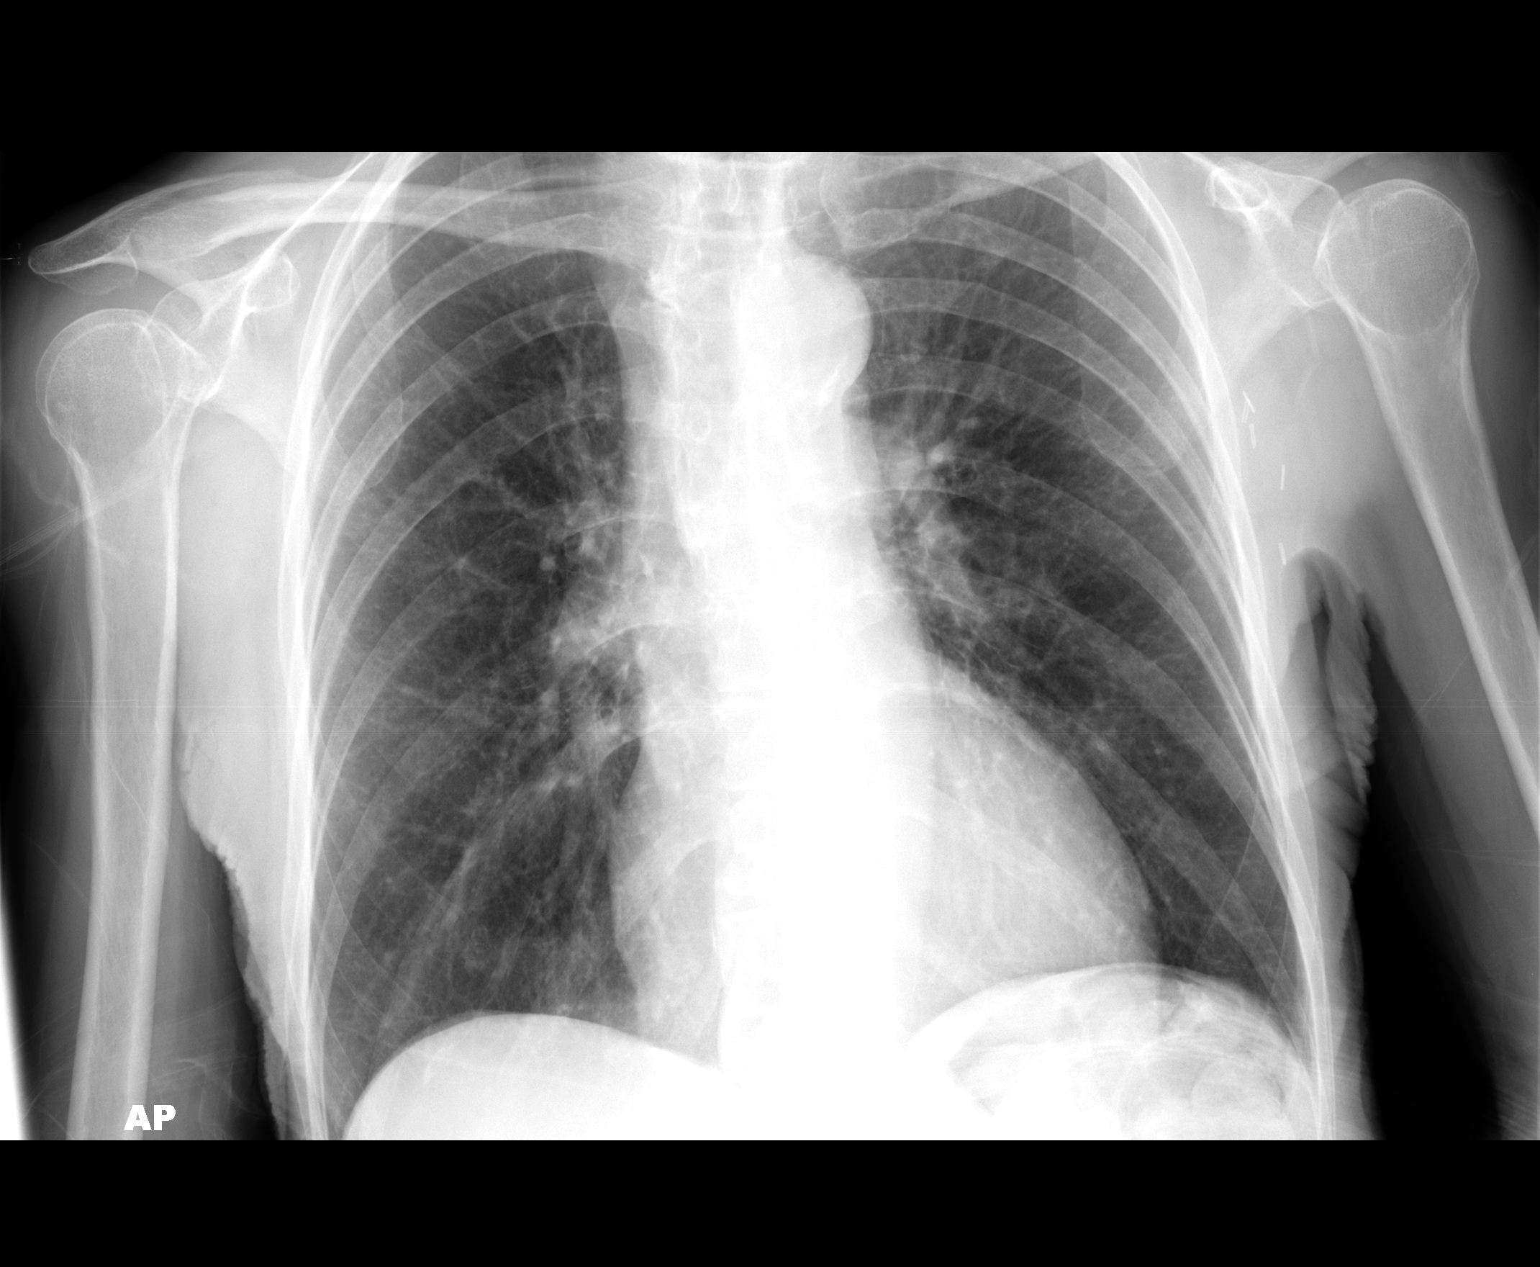

[2 of 2 positions shown; findings below may reference images not displayed]

FINDINGS: Stable COPD.  No infiltrate, edema, or pleural effusion.  Stable heart size.
IMPRESSION: Stable COPD.  No active disease.

## 2009-06-15 ENCOUNTER — Ambulatory Visit: Payer: Self-pay | Admitting: Internal Medicine

## 2009-08-17 ENCOUNTER — Encounter: Admission: RE | Admit: 2009-08-17 | Discharge: 2009-08-17 | Payer: Self-pay | Admitting: Internal Medicine

## 2009-08-17 LAB — HM MAMMOGRAPHY

## 2009-09-14 ENCOUNTER — Ambulatory Visit: Payer: Self-pay | Admitting: Cardiology

## 2009-09-26 ENCOUNTER — Ambulatory Visit: Payer: Self-pay | Admitting: Cardiology

## 2009-10-03 ENCOUNTER — Inpatient Hospital Stay (HOSPITAL_COMMUNITY)
Admission: EM | Admit: 2009-10-03 | Discharge: 2009-10-08 | Payer: Self-pay | Source: Home / Self Care | Admitting: Emergency Medicine

## 2009-10-03 ENCOUNTER — Ambulatory Visit: Payer: Self-pay | Admitting: Internal Medicine

## 2009-10-12 ENCOUNTER — Ambulatory Visit: Payer: Self-pay | Admitting: Cardiology

## 2009-10-23 ENCOUNTER — Ambulatory Visit: Payer: Self-pay | Admitting: Cardiology

## 2009-11-21 ENCOUNTER — Ambulatory Visit: Payer: Self-pay | Admitting: Cardiology

## 2009-12-19 ENCOUNTER — Ambulatory Visit: Payer: Self-pay | Admitting: Cardiovascular Disease

## 2009-12-19 ENCOUNTER — Ambulatory Visit: Payer: Self-pay | Admitting: Internal Medicine

## 2010-01-16 ENCOUNTER — Ambulatory Visit: Payer: Self-pay | Admitting: Cardiovascular Disease

## 2010-01-31 ENCOUNTER — Ambulatory Visit: Payer: Self-pay | Admitting: Cardiology

## 2010-02-21 ENCOUNTER — Ambulatory Visit: Payer: Self-pay | Admitting: Cardiology

## 2010-03-05 ENCOUNTER — Encounter: Payer: Self-pay | Admitting: Internal Medicine

## 2010-03-20 ENCOUNTER — Emergency Department (HOSPITAL_COMMUNITY): Payer: Medicare Other

## 2010-03-20 ENCOUNTER — Encounter (INDEPENDENT_AMBULATORY_CARE_PROVIDER_SITE_OTHER): Payer: Medicare Other

## 2010-03-20 ENCOUNTER — Emergency Department (HOSPITAL_COMMUNITY)
Admission: EM | Admit: 2010-03-20 | Discharge: 2010-03-21 | Disposition: A | Discharge status: Home / Self Care | Payer: Medicare Other | Attending: Emergency Medicine | Admitting: Emergency Medicine

## 2010-03-20 DIAGNOSIS — Z7901 Long term (current) use of anticoagulants: Secondary | ICD-10-CM

## 2010-03-20 DIAGNOSIS — M25559 Pain in unspecified hip: Secondary | ICD-10-CM | POA: Insufficient documentation

## 2010-03-20 DIAGNOSIS — I252 Old myocardial infarction: Secondary | ICD-10-CM | POA: Insufficient documentation

## 2010-03-20 DIAGNOSIS — Z8679 Personal history of other diseases of the circulatory system: Secondary | ICD-10-CM | POA: Insufficient documentation

## 2010-03-20 DIAGNOSIS — W108XXA Fall (on) (from) other stairs and steps, initial encounter: Secondary | ICD-10-CM | POA: Insufficient documentation

## 2010-03-20 DIAGNOSIS — I4891 Unspecified atrial fibrillation: Secondary | ICD-10-CM

## 2010-03-20 DIAGNOSIS — Y92009 Unspecified place in unspecified non-institutional (private) residence as the place of occurrence of the external cause: Secondary | ICD-10-CM | POA: Insufficient documentation

## 2010-03-20 DIAGNOSIS — R42 Dizziness and giddiness: Secondary | ICD-10-CM | POA: Insufficient documentation

## 2010-03-20 DIAGNOSIS — I10 Essential (primary) hypertension: Secondary | ICD-10-CM | POA: Insufficient documentation

## 2010-03-20 DIAGNOSIS — J3489 Other specified disorders of nose and nasal sinuses: Secondary | ICD-10-CM | POA: Insufficient documentation

## 2010-03-20 LAB — PROTIME-INR: INR: 2.25 — ABNORMAL HIGH (ref 0.00–1.49)

## 2010-03-21 LAB — URINALYSIS, ROUTINE W REFLEX MICROSCOPIC
Bilirubin Urine: NEGATIVE
Nitrite: NEGATIVE
Specific Gravity, Urine: 1.009 (ref 1.005–1.030)
pH: 7.5 (ref 5.0–8.0)

## 2010-03-26 ENCOUNTER — Encounter: Payer: PRIVATE HEALTH INSURANCE | Admitting: Internal Medicine

## 2010-03-26 ENCOUNTER — Ambulatory Visit (INDEPENDENT_AMBULATORY_CARE_PROVIDER_SITE_OTHER): Payer: Medicare Other | Admitting: Internal Medicine

## 2010-03-26 DIAGNOSIS — G219 Secondary parkinsonism, unspecified: Secondary | ICD-10-CM

## 2010-03-26 DIAGNOSIS — D649 Anemia, unspecified: Secondary | ICD-10-CM

## 2010-04-17 ENCOUNTER — Other Ambulatory Visit: Payer: PRIVATE HEALTH INSURANCE

## 2010-04-18 ENCOUNTER — Other Ambulatory Visit (INDEPENDENT_AMBULATORY_CARE_PROVIDER_SITE_OTHER): Payer: Medicare Other

## 2010-04-18 DIAGNOSIS — Z7901 Long term (current) use of anticoagulants: Secondary | ICD-10-CM

## 2010-04-18 DIAGNOSIS — I4891 Unspecified atrial fibrillation: Secondary | ICD-10-CM

## 2010-04-25 ENCOUNTER — Encounter (INDEPENDENT_AMBULATORY_CARE_PROVIDER_SITE_OTHER): Payer: Medicare Other

## 2010-04-25 DIAGNOSIS — Z7901 Long term (current) use of anticoagulants: Secondary | ICD-10-CM

## 2010-04-25 DIAGNOSIS — I4891 Unspecified atrial fibrillation: Secondary | ICD-10-CM

## 2010-04-27 LAB — CARDIAC PANEL(CRET KIN+CKTOT+MB+TROPI)
CK, MB: 1.9 ng/mL (ref 0.3–4.0)
Total CK: 65 U/L (ref 7–177)
Troponin I: 0.03 ng/mL (ref 0.00–0.06)
Troponin I: 0.07 ng/mL — ABNORMAL HIGH (ref 0.00–0.06)

## 2010-04-27 LAB — DIFFERENTIAL
Lymphocytes Relative: 22 % (ref 12–46)
Lymphs Abs: 2 10*3/uL (ref 0.7–4.0)
Monocytes Relative: 9 % (ref 3–12)
Neutro Abs: 5.9 10*3/uL (ref 1.7–7.7)
Neutrophils Relative %: 64 % (ref 43–77)

## 2010-04-27 LAB — BASIC METABOLIC PANEL
BUN: 13 mg/dL (ref 6–23)
BUN: 16 mg/dL (ref 6–23)
BUN: 18 mg/dL (ref 6–23)
BUN: 20 mg/dL (ref 6–23)
BUN: 26 mg/dL — ABNORMAL HIGH (ref 6–23)
CO2: 22 mEq/L (ref 19–32)
CO2: 26 mEq/L (ref 19–32)
CO2: 30 mEq/L (ref 19–32)
Calcium: 10.2 mg/dL (ref 8.4–10.5)
Calcium: 10.4 mg/dL (ref 8.4–10.5)
Calcium: 9.8 mg/dL (ref 8.4–10.5)
Calcium: 9.9 mg/dL (ref 8.4–10.5)
Chloride: 105 mEq/L (ref 96–112)
Chloride: 106 mEq/L (ref 96–112)
Creatinine, Ser: 0.86 mg/dL (ref 0.4–1.2)
Creatinine, Ser: 0.88 mg/dL (ref 0.4–1.2)
Creatinine, Ser: 0.89 mg/dL (ref 0.4–1.2)
Creatinine, Ser: 1.17 mg/dL (ref 0.4–1.2)
GFR calc Af Amer: >60 mL/min (ref >60)
GFR calc Af Amer: >60 mL/min (ref >60)
GFR calc Af Amer: >60 mL/min (ref >60)
GFR calc non Af Amer: 51 mL/min — ABNORMAL LOW (ref >60)
GFR calc non Af Amer: >60 mL/min (ref >60)
GFR calc non Af Amer: >60 mL/min (ref >60)
Glucose, Bld: 85 mg/dL (ref 70–99)
Glucose, Bld: 98 mg/dL (ref 70–99)
Potassium: 3.7 mEq/L (ref 3.5–5.1)
Potassium: 4 mEq/L (ref 3.5–5.1)
Sodium: 136 mEq/L (ref 135–145)

## 2010-04-27 LAB — CBC
Hemoglobin: 13.8 g/dL (ref 12.0–15.0)
MCH: 27.8 pg (ref 26.0–34.0)
RBC: 4.97 MIL/uL (ref 3.87–5.11)

## 2010-04-27 LAB — MAGNESIUM
Magnesium: 1.9 mg/dL (ref 1.5–2.5)
Magnesium: 2 mg/dL (ref 1.5–2.5)
Magnesium: 2.1 mg/dL (ref 1.5–2.5)

## 2010-04-27 LAB — POCT I-STAT, CHEM 8
BUN: 18 mg/dL (ref 6–23)
Chloride: 106 mEq/L (ref 96–112)
Sodium: 144 mEq/L (ref 135–145)

## 2010-04-27 LAB — POCT CARDIAC MARKERS
Myoglobin, poc: 70.7 ng/mL (ref 12–200)
Troponin i, poc: <0.05 ng/mL (ref 0.00–0.09)

## 2010-04-27 LAB — PROTIME-INR
INR: 1.23 (ref 0.00–1.49)
INR: 2.58 — ABNORMAL HIGH (ref 0.00–1.49)
INR: 2.66 — ABNORMAL HIGH (ref 0.00–1.49)
Prothrombin Time: 18.8 seconds — ABNORMAL HIGH (ref 11.6–15.2)
Prothrombin Time: 28.4 seconds — ABNORMAL HIGH (ref 11.6–15.2)

## 2010-04-27 LAB — CK TOTAL AND CKMB (NOT AT ARMC)
Relative Index: INVALID (ref 0.0–2.5)
Total CK: 68 U/L (ref 7–177)

## 2010-04-27 LAB — TROPONIN I: Troponin I: 0.03 ng/mL (ref 0.00–0.06)

## 2010-05-02 ENCOUNTER — Ambulatory Visit (INDEPENDENT_AMBULATORY_CARE_PROVIDER_SITE_OTHER): Payer: Medicare Other | Admitting: *Deleted

## 2010-05-02 DIAGNOSIS — Z7901 Long term (current) use of anticoagulants: Secondary | ICD-10-CM

## 2010-05-02 DIAGNOSIS — I4891 Unspecified atrial fibrillation: Secondary | ICD-10-CM

## 2010-05-02 LAB — CARDIAC PANEL(CRET KIN+CKTOT+MB+TROPI)
CK, MB: 1.8 ng/mL (ref 0.3–4.0)
CK, MB: 2 ng/mL (ref 0.3–4.0)
Relative Index: INVALID (ref 0.0–2.5)
Total CK: 59 U/L (ref 7–177)
Troponin I: 0.03 ng/mL (ref 0.00–0.06)

## 2010-05-02 LAB — DIFFERENTIAL
Basophils Absolute: 0.1 10*3/uL (ref 0.0–0.1)
Eosinophils Absolute: 0.3 10*3/uL (ref 0.0–0.7)
Lymphocytes Relative: 21 % (ref 12–46)
Lymphs Abs: 1.9 10*3/uL (ref 0.7–4.0)
Monocytes Absolute: 0.7 10*3/uL (ref 0.1–1.0)
Monocytes Absolute: 0.8 10*3/uL (ref 0.1–1.0)
Monocytes Relative: 8 % (ref 3–12)
Neutro Abs: 6.6 10*3/uL (ref 1.7–7.7)
Neutro Abs: 6.7 10*3/uL (ref 1.7–7.7)
Neutrophils Relative %: 68 % (ref 43–77)
Neutrophils Relative %: 68 % (ref 43–77)

## 2010-05-02 LAB — BASIC METABOLIC PANEL
CO2: 28 mEq/L (ref 19–32)
CO2: 29 mEq/L (ref 19–32)
Chloride: 104 mEq/L (ref 96–112)
GFR calc Af Amer: >60 mL/min (ref >60)
GFR calc non Af Amer: 59 mL/min — ABNORMAL LOW (ref >60)
Glucose, Bld: 107 mg/dL — ABNORMAL HIGH (ref 70–99)
Potassium: 3.4 mEq/L — ABNORMAL LOW (ref 3.5–5.1)
Potassium: 3.8 mEq/L (ref 3.5–5.1)
Sodium: 138 mEq/L (ref 135–145)
Sodium: 141 mEq/L (ref 135–145)

## 2010-05-02 LAB — POCT CARDIAC MARKERS
Myoglobin, poc: 136 ng/mL (ref 12–200)
Troponin i, poc: <0.05 ng/mL (ref 0.00–0.09)

## 2010-05-02 LAB — CBC
Hemoglobin: 12.1 g/dL (ref 12.0–15.0)
Hemoglobin: 12.6 g/dL (ref 12.0–15.0)
MCV: 82.6 fL (ref 78.0–100.0)
RBC: 4.59 MIL/uL (ref 3.87–5.11)
RDW: 16.5 % — ABNORMAL HIGH (ref 11.5–15.5)
WBC: 9.7 10*3/uL (ref 4.0–10.5)

## 2010-05-02 LAB — POCT I-STAT, CHEM 8
BUN: 22 mg/dL (ref 6–23)
Chloride: 106 mEq/L (ref 96–112)
Sodium: 140 mEq/L (ref 135–145)

## 2010-05-02 LAB — PROTIME-INR
INR: 1.85 — ABNORMAL HIGH (ref 0.00–1.49)
INR: 1.89 — ABNORMAL HIGH (ref 0.00–1.49)
Prothrombin Time: 21.5 seconds — ABNORMAL HIGH (ref 11.6–15.2)
Prothrombin Time: 22 seconds — ABNORMAL HIGH (ref 11.6–15.2)

## 2010-05-02 LAB — POCT INR
INR: 2.7
INR: 2.7

## 2010-05-02 LAB — D-DIMER, QUANTITATIVE: D-Dimer, Quant: 0.55 ug/mL-FEU — ABNORMAL HIGH (ref 0.00–0.48)

## 2010-05-02 LAB — CK TOTAL AND CKMB (NOT AT ARMC): Relative Index: INVALID (ref 0.0–2.5)

## 2010-05-02 LAB — TSH: TSH: 3.697 u[IU]/mL (ref 0.350–4.500)

## 2010-05-02 LAB — MAGNESIUM: Magnesium: 2.1 mg/dL (ref 1.5–2.5)

## 2010-05-16 ENCOUNTER — Ambulatory Visit (INDEPENDENT_AMBULATORY_CARE_PROVIDER_SITE_OTHER): Payer: Medicare Other | Admitting: *Deleted

## 2010-05-16 DIAGNOSIS — I4891 Unspecified atrial fibrillation: Secondary | ICD-10-CM

## 2010-05-16 DIAGNOSIS — Z7901 Long term (current) use of anticoagulants: Secondary | ICD-10-CM

## 2010-05-16 LAB — POCT INR: INR: 2.7

## 2010-06-13 ENCOUNTER — Ambulatory Visit (INDEPENDENT_AMBULATORY_CARE_PROVIDER_SITE_OTHER): Payer: Medicare Other | Admitting: *Deleted

## 2010-06-13 DIAGNOSIS — I4891 Unspecified atrial fibrillation: Secondary | ICD-10-CM

## 2010-06-13 LAB — POCT INR: INR: 2.8
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
## 2010-06-26 NOTE — Procedures (Signed)
EEG NUMBER:  [id number].   HISTORY:  This is an 78 year old with seizures and bizarre behaviors is  having EEG done to evaluate for seizure activity.   PROCEDURE:  This is a portable EEG.   TECHNICAL DESCRIPTION:  Throughout this portable EEG the patient begins  in stage II sleep with the appearance of symmetric sleep spindles.  Once  the patient had an arousal, there does seem to be a posterior dominant  rhythm of 8-9 Hz activity at 15-30 microvolts.  Background activity is  fairly symmetric mostly comprised of lower alpha and upper theta range  activity at 10-30 microvolts.  Photic stimulation nor hyperventilation  performed throughout this record.  Throughout this tracing there is no  evidence of definitive epileptiform activity.  The EKG tracing shows a  heart rate of 60-80 beats per minute.   IMPRESSION:  This portable EEG is within normal limits in the awake and  sleep states.      Bevelyn Buckles. Nash Shearer, M.D.  Electronically Signed     ZOX:WRUE  D:  03/23/2007 19:23:42  T:  03/25/2007 09:29:13  Job #:  [id number]

## 2010-06-26 NOTE — Discharge Summary (Signed)
[name] [name], [name] [name]               ACCOUNT NO.:  [id number]   MEDICAL RECORD NO.:  [id number]          PATIENT TYPE:  INP   LOCATION:  1420                         FACILITY:  [facility]   PHYSICIAN:  Wilson Singer, M.D.DATE OF BIRTH:  October 05, 1920   DATE OF ADMISSION:  03/19/2007  DATE OF DISCHARGE:  03/27/2007                               DISCHARGE SUMMARY   ADDENDUM   FINAL DISCHARGE DIAGNOSES:  1. Altered mental status secondary to seizure.  2. Rash, felt to be due to Dilantin.  3. Blurred vision, felt to be due to Dilantin (Dilantin discontinued 2      days ago).  4. Dizziness, possibly vestibular dysfunction, possibly related to      relatively low blood pressure or Dilantin.  5. Atrial fibrillation, on amiodarone and Coumadin therapy.  6. Calvarial lesion on MRI scanning but a bone scan negative for this.      Dr. Pierce Crane evaluated and felt there was no recurrence of      breast cancer.   MEDICATIONS ON DISCHARGE:  1. Amlodipine 2.5 mg daily.  2. Potassium chloride 20 mEq daily.  3. Amiodarone 100 mg daily.  4. Multivitamin 1 tablet daily.  5. Coumadin depending on prothrombin time.  6. Nystatin 4 mL q.i.d.  7. Keppra 250 mg b.i.d.  8. Ciprofloxacin 250 mg b.i.d. for a total of 5 days, to discontinue      on March 31, 2007.  9. Atarax 25 mg q.i.d. p.r.n.  10.Senokot 1 tablet q.h.s. p.r.n.   CONDITION ON DISCHARGE:  Stable.   HISTORY:  This 77 year old lady presented with altered mental status.  Please see initial history and physical examination done by Dr. Buena Irish and also the consultation done by Dr. Melvyn Novas,  as  well as interim summary dictated by Dr. Hannah Beat.   HOSPITAL PROGRESS:  Since the last few days, she has progressed.  She  has had a rash on her trunk and legs, which was felt to be due to  Dilantin, and this was discontinued 2 days ago.  She is now on Keppra.  Her dizziness has somewhat improved.  Also she was hyponatremic,  which  has also improved since discontinuing the ACE inhibitor.  Today she  feels better except she is concerned about the blurred vision.  On  closer examination, she has clear vision at close quarters.   PHYSICAL EXAMINATION:  Temperature 98.1, blood pressure 115/55, pulse  64, saturation 95% on room air.  The rash present is widespread on her  trunk and limbs but is clearly improved.  There is no obvious external  ocular movement abnormality that I can see.  There are no obvious focal  neurological signs.   Investigations today show a sodium normalized at 137, potassium 3.7,  bicarbonate 30, BUN 9, creatinine 0.79.  INR therapeutic at 2.0.   FURTHER DISPOSITION:  I am further reducing her Norvasc to 2.5 mg today  in the hope of improving her blood pressure, which may be contributing  in part to her dizziness.  Blood pressure will need to be monitored on  a  regular basis and adjustments made accordingly.  Please note that  amiodarone has some of the similar effects of Norvasc.  Ciprofloxacin  has been started on the presumption of urinary tract infection based on  urinalysis and cultures are awaited.  The ciprofloxacin will be  continued for a total of 5 days.  She will need rehab for 1-2 weeks to  see if there is improvement prior to going home.  I will ask Dr. Peter  Swaziland to review her in terms of her medications at the patient's  request.      Wilson Singer, M.D.  Electronically Signed     NCG/MEDQ  D:  03/27/2007  T:  03/28/2007  Job:  [id number]

## 2010-06-26 NOTE — Assessment & Plan Note (Signed)
HEALTHCARE                         GASTROENTEROLOGY OFFICE NOTE   NAME:Page, TAMECA JEREZ                      MRN:          [id number]  DATE:09/26/2006                            DOB:          24-Sep-1920    SUBJECTIVE:  Ms. Sobotta is a delightful 77 year old white female who  was recently hospitalized with anemia, heme-positive stool, chronic  atrial fibrillation, currently anticoagulated with Coumadin.  Her  hemoglobin was as low as 8 grams.  Her tumor marker CEA was negative.  Upper endoscopy and colonoscopy were negative for any GI blood loss.   She was scheduled for a small bowel capsular endoscopy, which was  completed about three weeks ago, but unfortunately the capsule did not  pass beyond the pylorus.  It was quite a disappointment because the  patient did not have any signs of gastroparesis or gastric outlet  obstruction.  She has not been charged for this test.   She came today to discussion further options.  The patient is feeling  reasonably well.  She does not see any blood in her stools.  Her energy  level is getting better on iron 325 mg three times daily.  She continues  her Coumadin and her INR has been monitored by Dr. Peter M. Swaziland.   PHYSICAL EXAMINATION:  VITAL SIGNS:  Blood pressure 154/62, pulse 60,  weight 125 pounds.  GENERAL:  She appeared in no distress.  HEENT:  Sclerae anicteric.  Oral cavity was normal.  LUNGS:  Clear to auscultation.  COR:  Regular rhythm.  Normal S1, normal S2.  ABDOMEN:  Soft, nontender, with hyperactive bowel sounds.  No bruits.  No tenderness.  RECTAL:  Black Hemoccult-positive stool.   ASSESSMENT:  An 77 year old white female with ongoing low-grade  gastrointestinal blood loss, most likely originating in the small bowel,  likely from arteriovenous malformations.   PLAN:  We were unable to complete the capsular endoscopy study, and in  order to complete the study, she would have to have  the capsule inserted  endoscopically distal to the pylorus.  Although this is an option, I  would first prefer to keep her on iron and check her CBC, because if her  blood count remains stable at normal levels, the capsular endoscopy may  have just an academic purpose, since the treatment would result in  continued iron indefinitely.  On the other side, if the blood count  fails to respond to oral iron, indicating that she is losing more blood  than she can produce, we will have to do a capsular endoscopy.  I would  also consider reducing her Coumadin or possibly switching her to another  anticoagulant after a discussion with Dr. Swaziland.  We have to be aware  of the risks of possible stroke, if the patient is taken off of  Coumadin, in the presence of atrial fibrillation.  I would have to  discuss this with Dr. Swaziland.  For now, she will  have a CBC today and continue her iron supplements and have a CBC every  month while continuing her iron.  She is to see  Dr. Swaziland in the next  few weeks.     Hedwig Morton. Juanda Chance, MD  Electronically Signed    DMB/MedQ  DD: 09/26/2006  DT: 09/26/2006  Job #: [id number]   cc:   Luanna Cole. Lenord Fellers, M.D.  Peter M. Sw[place]iland, M.D.
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
## 2010-06-29 NOTE — Discharge Summary (Signed)
NAME:  [name] [name], [name] [name]                         ACCOUNT NO.:  [id number]   MEDICAL RECORD NO.:  [id number]                   PATIENT TYPE:  INP   LOCATION:  0464                                 FACILITY:  [facility]   PHYSICIAN:  Ollen Gross, M.D.                 DATE OF BIRTH:  03/03/1920   DATE OF ADMISSION:  08/29/2003  DATE OF DISCHARGE:  09/02/2003                                 DISCHARGE SUMMARY   ADMISSION DIAGNOSES:  1. Osteoarthritis, right knee.  2. Hypertension.  3. Paroxysmal atrial fibrillation.  4. History of myocardial infarction, 1996.  5. Hypercholesterolemia.  6. History of left facial stroke.  7. History of breast cancer, status post mastectomy.   DISCHARGE DIAGNOSES:  1. Osteoarthritis, right knee, status post right total knee arthroplasty.  2. Mild postoperative blood loss anemia.  3. Hypokalemia, improved.  4. Hyponatremia, improved.  5. Low chloride, improved.  6. Hypertension.  7. Paroxysmal atrial fibrillation.  8. History of myocardial infarction, 1996.  9. Hypercholesterolemia.  10.      History of left facial stroke.  11.      History of breast cancer, status post mastectomy.   PROCEDURE:  Date of surgery:  August 29, 2003, right total knee arthroplasty.   SURGEON:  Ollen Gross, M.D.   ASSISTANT:  Alexzandrew L. Julien Girt, P.A.   ANESTHESIA:  General.   ___________   ESTIMATED BLOOD LOSS:  Minimal blood loss.   DRAINS:  Hemovac drain x1.   BRIEF HISTORY AND PHYSICAL:  Ms. Hickok is an 77 year old female with end-  stage arthritis of the right knee.  Pain has been refractory, now presents  for total knee arthroplasty.   CONSULTATIONS:  None.   LABORATORY DATA:  CBC on admission:  Hemoglobin 11.8, hematocrit 34.8, white  cell count 7.4, red cell count 4.03, differential within normal limits.  Postoperative H&H 9.4 and 27.7, last known H&H 8.8 and 25.7.  PT/PTT  preoperative 13.2 and 25 respectively, INR 1.  Pro time was followed per  Coumadin protocol.  Last known PT/INR 13.3 and 1.1.  Chem panel on  admission:  Albumin 3.4, minimally elevated glucose of 115, remaining chem  panel all within normal limits.  Serial BMETs were followed.  Sodium dropped  from 135 down to 123, back up to 132.  Potassium dropped from 3.7 down to  3.2 back up to 3.8.  Chloride dropped from 101 down to 91, back up to 102.  Glucose went up from 115 to 140, back down to 115.  Calcium dropped from 9.2  down to 8.1 back up to 8.3.  CA-27.29 tumor marker was taken and the level  was 6 which was within normal limits.  Urinalysis preoperative:  Trace  hemoglobin, 0-2 red cells, rare epithelial cells, otherwise negative.  Blood  type O negative.   EKG dated August 24, 2003:  Normal sinus rhythm with occasional premature  supraventricular  complex and nonspecific T-wave abnormalities.  No  significant change since last tracing, per Dr. Olga Millers.  Two view  chest taken on August 24, 2003:  No evidence of active disease within the  chest, post left mastectomy.   HOSPITAL COURSE:  The patient was admitted  to [facility], taken  to the OR, underwent the above stated procedure without complication.  The  patient tolerated the procedure well and went to the recovery room then to  orthopedic floor to continue postoperative care.  Vital signs were followed.  Hemovac drain was placed on the surgery and pulled on postoperative day #1.  She had a decent night the evening after surgery, was up out of bed by day  #2.  The patient is doing fairly well, had a little bit of confusion that  night between day #1 and day #2, but much better in the morning.  The  narcotics were discontinued and current p.o. medications.  Dressing was  changed.  Incision was healing well.  The Marcaine plain pump placed under  surgery was  pulled on day #2.  She was also noted to have a drop in her  sodium, a drop in her potassium.  Fluid restrictions were placed.  Potassium   was supplemented.  IV narcotics were discontinued.  She did really well with  physical therapy on day #2.  She was up ambulating approximately 80 feet  with minimal assist and later 100 feet that afternoon.  By day #3 she was  doing even better.  Electrolytes started to improve.  Potassium was up a  little bit.  Sodium remained the same but did improve later in the hospital  course.  We continued the fluid restriction.  She was up ambulating  approximately 200 feet by day #3.  By day #4 sodium had improved.  She was  doing very well with physical therapy.  Electrolytes had corrected.  It was  felt she had had a little bit of sundowning just in the evening but she was  always clear the next morning throughout the day.  She was doing much  better, seen on rounds by Dr. Lequita Halt and it was felt the patient could be  discharged home.   DISCHARGE PLAN:  Discharge home on September 02, 2003.   DISCHARGE DIAGNOSES:  Please see above.   DISCHARGE MEDICATIONS:  1. She was given Vicodin.  2. Robaxin.   DIET:  Low sodium diet.   ACTIVITY:  Weight bearing as tolerated.  Home health PT and home health  nursing.  Continue gait training ambulation, ADLs.  Total knee protocol.   FOLLOW UP:  Two weeks from surgery.   DISPOSITION:  Home.   CONDITION ON DISCHARGE:  Improved.     Alexzandrew L. Julien Girt, P.A.              Ollen Gross, M.D.    ALP/MEDQ  D:  10/05/2003  T:  10/05/2003  Job:  [id number]   cc:   Ollen Gross, M.D.  [facility]  [address]  [address]  [place]  [place] [address]  Fax: [phone]   Luanna Cole. Lenord Fellers, M.D.  [address]  [place]  [place] [address]  Fax: [phone]   Peter M. Swaziland, M.D.  [address]  [place], [place] [address]  Fax: [phone]

## 2010-06-29 NOTE — Discharge Summary (Signed)
[place]  Patient:    [name] [name], [name] [name] Visit Number: [id number] MRN: [id number]          Service Type: MED Location: [id number] Attending Physician:  [place], Peter Manning Dictated by:   Peter M. [place], M.D. Admit Date:  08/24/2001 Discharge Date: 08/25/2001   CC:         Luanna Cole. Lenord Fellers, M.D.   Discharge Summary  HISTORY OF PRESENT ILLNESS:  Ms. Frechette is a 77 year old white female who presented with persistent tachycardia.  The patient has a remote history of atrial fibrillation associated with TIA.  She has been on chronic Coumadin therapy and Lanoxin.  She reports that it had been years since she had had an episode of atrial fibrillation.  However, on the night of admission, the patient developed sustained tachyarrhythmia beginning at approximately 11 p.m. She presented to the emergency room at approximately 2 a.m.  She subsequently converted to normal sinus rhythm.  She had no significant chest pain or shortness of breath associated with this.  She had no dizziness or presyncope. Her major complaint is rapid heartbeat and feeling her heart pounding.  PAST MEDICAL HISTORY:  Significant for previous history of idiopathic dilated cardiomyopathy that has improved with medical therapy.  She has an old TIA. She has been on chronic Coumadin therapy.  For details of her past medical history, social history, family history, and physical examination, please see admission history and physical.  LABORATORY DATA:  An ECG on admission showed atrial fibrillation, rapid ventricular response, and nonspecific ST-T wave abnormality.  Repeat ECG in normal sinus rhythm was normal.  Chest x-ray showed no active disease.  White count was 8300, hemoglobin 11.5, hematocrit 34.6, platelets 254,000. Potassium was 3.2 on admission.  Other chemistries were normal.  Glucose was 154.  CK was 110 with negative MB.  Digoxin level was 1.1.  Troponin 0.02. Pro  time was 22.9 with an INR of 2.3.  Repeat CBC the following morning showed a normal hemoglobin of 12.3.  Other CBC parameters were normal.  CK MBs were negative x4.  Troponin increased slightly to 0.09.  Given her normal ECG and lack of symptoms, this was felt probably related to her atrial fibrillation. She had no recurrence of her atrial fibrillation during her hospital stay.  No further complaints.  We did add a low dose beta blocker to her Lanoxin for therapy of her atrial fibrillation.  She was discharged home in stable condition on 08/25/01.  The patient also had an echocardiogram performed.  This demonstrated normal left ventricular function.  She had mild mitral insufficiency and mild left atrial enlargement.  FINAL INTERPRETATION: 1. Atrial fibrillation, paroxysmal, with rapid ventricular response. 2. Remote history of idiopathic cardiomyopathy, resolved. 3. History of hypertension. 4. History of breast carcinoma. 5. Old transient ischemic attack. 6. Chronic anticoagulation therapy.  DISCHARGE MEDICATIONS: 1. The patient will remain on her prior medications which include:    a. Claritin 10 mg daily.    b. Xanax 0.5 mg b.i.d. p.r.n.    c. Tamoxifen 10 mg 2 tablets daily.    d. Hydrochlorothiazide 12.5 mg daily.    e. Zocor 10 mg per day.    f. Lanoxin 0.25 mg daily.    g. Vasotec 10 mg p.o. b.i.d.    h. Coumadin 5 mg daily, except 2 tablets 2 days a week.    i. Toprol XL 25 mg per day. 2. The patient will follow up with Dr. Swaziland  in 2-3 weeks.  DISCHARGE STATUS:  Improved. Dictated by:   Peter M. [place]iland, M.D. Attending Physician:  [place], Peter Manning DD:  08/25/01 TD:  08/27/01 Job: [id number]

## 2010-06-29 NOTE — H&P (Signed)
. [facility]  Patient:    [name] [name], [name] [name] Visit Number: [id number] MRN: [id number]          Service Type: MED Location: [phone] Attending Physician:  Eleanora Neighbor Dictated by:   Theressa Millard, M.D. Admit Date:  08/24/2001   CC:         Luanna Cole. Lenord Fellers, M.D.  Peter M. Swazil[place], M.D.   History and Physical  CHIEF COMPLAINT:  Tachycardia.  HISTORY OF PRESENT ILLNESS:  Ms. [name] [name] is a very pleasant 77 year old white female who has known cardiac problems.  She presents to the hospital at approximately 2 a.m. this morning after having a spell of tachycardia.  She notes that she was in her usual state of health and had been feeling quite well.  She was able to go to church on Sunday and attended a bridal shower in the afternoon but really had done no excessive activities outside of her regular routine.  She was watching the 10 oclock news and fell asleep and then at 11:07 she awoke with the awareness of tachycardia.  She took an extra Xanax as well as Lanoxin and tried to lie back down; however, her symptoms persisted.  She had no associated symptoms and denies chest pain, shortness of breath, or presyncope.  In light of the ongoing tachycardia, she presented to the emergency room, where she was found to be in rapid atrial fibrillation. She was subsequently admitted for further evaluation.  PAST MEDICAL HISTORY:  1. Atrial fibrillation.  2. Chronic Coumadin therapy.  3. History of idiopathic cardiomyopathy.  She had cardiac catheterization     dating back to 1996, where she was noted to have moderate LV dysfunction     with hypokinesis involving the distal wall segments.  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  Her last 2 D     echocardiogram was in 1997, which showed an ejection fraction of 55%.  4. Hypertension.  5. Previous  history of TIA/CVA.  6. Chronic allergies.  7. Chronic anxiety.  8. Previous breast cancer, status post left mastectomy.  9. Status post cholecystectomy. 10. Previous hysterectomy.  She denies a history of diabetes.  ALLERGIES:  None.  MEDICATIONS:  1. Claritin 10 mg p.r.n.  2. Coumadin 5 x5, 2.5 x2.  3. Vasotec 10 mg b.i.d.  4. Lanoxin 0.25 mg daily.  5. Zocor 10 mg a day.  6. Xanax 0.5 mg p.r.n.  7. Hydrochlorothiazide 12.5 mg daily.  8. Tamoxifen 10 mg two tablets a day.  9. Vitamin E daily.  FAMILY HISTORY:  Noncontributory.  SOCIAL HISTORY:  She is married.  There is no current alcohol or tobacco.  REVIEW OF SYSTEMS:  Basically as stated above and is otherwise unremarkable. She states she has had no previous neurologic complaints.  She has had no chest pain, no shortness of breath.  She remains very active with her activities of daily living.  She has had no constipation, no diarrhea, no blood in the stool, and no peripheral edema.  Her palpitations have been fairly stable.  PHYSICAL EXAMINATION:  VITAL SIGNS:  Blood pressure is 134/60, heart rate is now in the 80s and regular, respirations 18.  She is afebrile.  GENERAL:  She is a very pleasant elderly female who appears younger than her stated age.  SKIN:  Warm and dry.  Color is unremarkable.  NECK:  Supple.  CHEST:  Lungs were clear.  CARDIAC:  Regular rhythm with no murmur.  ABDOMEN:  Soft, positive bowel sounds.  EXTREMITIES:  Full, but really no significant peripheral edema.  NEUROLOGIC:  Intact with no gross focal deficits.  LABORATORY DATA:  INR is therapeutic at 2.3.  Potassium was low at 3.2. CK and troponin were negative.  A 12-lead electrocardiogram initially showed atrial fibrillation with a rapid ventricular response and diffuse ST and T-wave changes.  OVERALL IMPRESSION: 1. Recurrent atrial fibrillation. 2. Chronic Coumadin therapy, currently therapeutic. 3. History of idiopathic  cardiomyopathy, which has been well-compensated. 4. Documented history of nonobstructive coronary disease. 5. Hypertension. 6. Previous transient ischemic attack. 7. Breast cancer, status post mastectomy.  PLAN:  She will be admitted to the service of Dr. Peter Swaziland.  Will repeat her 2 D echocardiogram, her home medicines will be continued, with further plan to be per his discretion. Dictated by:   Theressa Millard, M.D. Attending Physician:  Eleanora Neighbor DD:  08/24/01 TD:  08/24/01 Job: [id number]
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
## 2010-06-29 NOTE — Op Note (Signed)
[place]  Patient:    [name] [name]                       MRN: [id number] Proc. Date: 02/19/99 Adm. Date:  [id number] Attending:  Brandy Hale CC:         Luanna Cole. Lenord Fellers, M.D.             Peter M. [place], M.D.             Cheral Marker, M.D.                           Operative Report  PREOPERATIVE DIAGNOSIS:  Carcinoma of the left breast.  POSTOPERATIVE DIAGNOSIS:  Carcinoma of the left breast.  PROCEDURE:  Left total mastectomy, left sentinel lymph node mapping and biopsy,  injection of lymphazuran blue dye.  SURGEON:  Angelia Mould. Derrell Lolling, M.D.  FIRST ASSISTANT:  Maisie Fus B. Samuella Cota, M.D.  OPERATIVE INDICATIONS:  This is a 77 year old white female, who underwent mammograms recently.  There was an irregular density in the inferior aspect of he left breast that had not been seen on previous mammograms.  Further imaging was  performed and Dr. Sanjuana Kava performed an ultrasound-guided core-needle biopsy of this area in December 2000.   The final pathology report was interpreted by Dr.  Jonny Ruiz ______  showing invasive ductal carcinoma.  The patient has been counselled regarding her diagnosis and her options for surgical intervention.  She has elected to have a mastectomy and assessment of her left axillary lymph nodes.  OPERATIVE TECHNIQUE:  The patient underwent injection of radionuclide in the left breast by the radiologist at approximately 8:30 a.m.  She was brought to the operating room about 10:15, placed supine on the operating table, suitable general endotracheal anesthesia was induced.  The left breast was prepped with alcohol nd injected in the region of the tumor with 5 cc of Lymphazurin dye.  The breast was massaged for 5 minutes.  The left breast and left axilla were then prepped and draped in a sterile fashion.  Neoprobe was used and we were able to locate a very hot area of activity in the  left axilla.  We  marked out our mastectomy incision and it was somewhat oblique and we created this in such way so that the lateral aspect of the mastectomy incision could be used to do the lymph node dissection.  The lateral aspect of the incision was made obliquely just at level of the hairline.  Dissection was carried down through the subcutaneous tissue.  The clavipectoral fascia was incised.  We entered the axilla.  Using the neoprobe, we very carefully quickly found a 1.0 cm lymph  node, which was very blue in color and was extremely hot using the neoprobe. This lymph node was sent to pathology.  Imprint cytology performed by Dr. Debby Bud was negative for tumor cells.  We examined the rest of the axilla with the neoprobe and there was no other areas of activity.  We then proceeded with the mastectomy.  A generally transverse, but somewhat oblique elliptical incision was made in the left breast encompassing the entire  nipple/areolar complex and where the previous superiorly placed biopsy sites. kin flaps were elevated superiorly, medially, inferiorly and laterally.  The breast  tissue was dissected off of the pectoralis fascia with electrocautery. Dissection was carried down to the lateral border of the pectoralis major and  then the lateral border of the pectoralis minor muscle and we very carefully dissected out the tail of Spence of the breast.  Probably a small amount of axillary contents were also removed.  The specimen was sent for permanent histology.  Hemostasis was excellent and achieved with electrocautery.  Some smaller venous channels were controlled  with metal clips.  The wound was copiously irrigated with saline.  We again inspected the wound and found no bleeding.  Two 19-French Blake drains were placed.  One up in the axilla and one across the skin flaps.  These were brought out through separate stab wounds inferolaterally.  The drains were sutured to the skin with silk  sutures.  The skin was closed with skin staples and a clean occlusive  gauze was placed.  The patient tolerated the procedure well and was taken to recovery room in stable condition.  Estimated blood loss was about 100-125 cc.  COMPLICATIONS:  None.  Sponge, needle and instrument counts were correct. DD:  02/19/99 TD:  02/19/99 Job: [id number]

## 2010-06-29 NOTE — Op Note (Signed)
[place]. [facility]  Patient:    [name] [name], [name] [name]                      MRN: [id number] Proc. Date: 09/07/99 Adm. Date:  [id number] Disc. Date: [id number] Attending:  Brandy Hale                           Operative Report  PREOPERATIVE DIAGNOSIS:  Left axillary mass.  POSTOPERATIVE DIAGNOSIS:  Left axillary mass.  PROCEDURE PERFORMED:  Excision left axillary mass.  SURGEON:  Dr. Claud Kelp.  OPERATIVE INDICATIONS:  This is a 77 year old white female who underwent left total mastectomy and left lymph node biopsy in January of 2001.  She had invasive carcinoma of the left breast with T1N0 lesion.  She is followed by Dr. Pierce Crane.  She takes Tamoxifen.  We have noticed a lump in her left axilla and have followed that for about two months, and the mass has persisted and perhaps has enlarged.  It is immobile and about 1.5 cm in diameter, high in the left axilla.  She is brought to the operating room to excise this mass to rule out recurrent breast cancer.  OPERATIVE TECHNIQUE:  Following induction of general endotracheal anesthesia, the patients left axilla and left upper arm were prepped and draped in sterile fashion.  The lateral aspect of the mastectomy scar was extended up into the axilla.  Dissection was carried down through subcutaneous tissue.  We incised the clavipectoral fascia entered the axilla.  We felt some bumpy tissue consistent with a palpable mass and dissected this out away from the rest of the axillary contents.  What we actually removed did not feel malignant but felt like fibrotic lymph nodes and perhaps two or three of these.  Some vascular structures were controlled with metal clips and also some smaller ones with electrocautery.  After we had removed this area, we carefully inspected the axilla and felt no other palpable abnormality.  The wound was irrigated with saline.  Hemostasis was excellent.  The  subcutaneous tissue was closed with interrupted sutures of 3-0 Vicryl and the skin closed with a running subcuticular suture of 4-0 Vicryl and Steri-Strips.  Kling bandages were placed and the patient was taken to the recovery room in stable condition.  Estimated blood loss was about 10 cc.  Complications:  None. Sponge, needle and instrument counts were correct. DD:  09/07/99 TD:  09/09/99 Job: [id number]
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
## 2010-06-29 NOTE — Op Note (Signed)
NAME:  [name] [name], [name] [name]                         ACCOUNT NO.:  [id number]   MEDICAL RECORD NO.:  [id number]                   PATIENT TYPE:  INP   LOCATION:  NA                                   FACILITY:  [facility]   PHYSICIAN:  Ollen Gross, M.D.                 DATE OF BIRTH:  [birth year]   DATE OF PROCEDURE:  08/29/2003  DATE OF DISCHARGE:                                 OPERATIVE REPORT   PREOPERATIVE DIAGNOSIS:  Osteoarthritis of the right knee.   POSTOPERATIVE DIAGNOSES:  Osteoarthritis of the right knee.   OPERATION/PROCEDURE:  Right total knee arthroplasty.   SURGEON:  Ollen Gross, M.D.   ASSISTANT:  Alexzandrew L. Perkins, P.A.-C.   ANESTHESIA:  General with postoperative Marcaine pain pump.   ESTIMATED BLOOD LOSS:  Minimal.   DRAINS:  Hemovac x1.   COMPLICATIONS:  None.   DISPOSITION:  Stable to recovery.   BRIEF CLINICAL NOTE:  [name] [name] is an 77 year old female with end-stage  arthritis o the right knee with intractable pain.  She has failed  nonoperative management and presents now for right total knee arthroplasty.   DESCRIPTION OF PROCEDURE:  After the successful administration of general  anesthesia, tourniquet was placed on the right thigh and right lower  extremity prepped and draped in the usual sterile fashion. The extremity was  wrapped in Esmarch, knee flexed, tourniquet inflated to 300 mmHg.   Standard midline incision was made with a 10-blade through the subcutaneous  tissue to the level of the extensor mechanism.  A fresh blade was used to  make a medial parapatellar arthrotomy and the soft tissue of proximal media  tibia is subperiosteally elevated to the joint line with the knife and into  the semimembranosus bursa with a curved osteotome.  Soft tissue of the  proximal lateral tibia was also elevated with attention being paid to  avoiding the patellar tendon on the tibial tubercle.  Patella was then  subluxed and knee flexed 90 degrees and  the ACL and PCL removed.  Drill was  used to create a starting hole at the distal femur.  The canal was  irrigated.  A five-degree right valgus alignment guide was placed.  Referencing with the posterior condyles, rotation was marked and the block  pins removed 10 mm off the distal femur.  Distal femoral resection is made  with an oscillating saw.  The sizing block was placed and size 2.5 was the  most appropriate.  Rotation was marked off the epicondylar axis.  The AP  cutting block is placed and anterior and posterior cuts are made for a size  2.5.   The tibia was then subluxed forward and the menisci removed.  Extramedullary  tibial alignment guide placed surfacing proximally at the medial aspect of  the tibial tubercle and distally along the second metatarsal axis and tibial  crest.  Blocks pinned to remove 10 mm  of the slightly deficient lateral  side.  Tibial resection was made with an oscillating saw.  A sizing block  was placed and size 2.5 was most appropriate.  The femoral preparation was  then completed with the intercondylar chamfer cuts.   Size 2.5 posterior stabilized femur trial is placed.  A 2.5 tibial trial  with a 10 mm posterior stabilized fixed-bearing insert trial was placed.  She had full extension with excellent varus and valgus balance throughout  full range of motion.  The rotation is marked and then the proximal tibial  prepared with the modular drill and keel punch with a 2.5.  Patella  preparation was then made.  The thickness is measured to be 23 mm, free-hand  resection taken to 13 mm, a 35 template placed, lug holes were drilled,  trial patella was placed and it tracks normally.  The trials were all  removed except the femoral trial and osteophytes were removed off the  posterior femur.  Femoral trials were then removed and the cut bone surfaces  were then prepared with pulsatile lavage.  Cement was mixed and once ready  for implantation, the size 2.5  fixed-bearing tibial tray, size 2.5 posterior  stabilized femur and 35 patella are cemented into place and patella held  with a clamp.  The trial 10 mm insert was placed and the knee held in full  extension and all extruded cement removed.  Once the cement was fully  hardened, then the permanent 10 mm fixed-bearing posterior stabilized insert  tibial tray.  The wounds are then copiously irrigated with antibiotic  solution and the extensor mechanism closed over a Hemovac drain with  interrupted #1 PDS.  Flexion against gravity is 135 degrees.  Total  tourniquet time was 46 minutes.  The tourniquet was then released.  Subcutaneous tissues were then closed with interrupted 2-0 Vicryl,  subcuticular running 4-0 Monocryl.  The catheter for Marcaine pain pump is  placed and the pump initiated.  A bulky sterile dressing applied.  She was  awakened and transported to recovery in stable condition.                                               Ollen Gross, M.D.    FA/MEDQ  D:  08/29/2003  T:  08/29/2003  Job:  [id number]

## 2010-06-29 NOTE — H&P (Signed)
NAME:  [name] [name], [name] [name]                         ACCOUNT NO.:  [id number]   MEDICAL RECORD NO.:  [id number]                   PATIENT TYPE:  INP   LOCATION:  NA                                   FACILITY:  [facility]   PHYSICIAN:  Ollen Gross, M.D.                 DATE OF BIRTH:  1920-03-21   DATE OF ADMISSION:  08/29/2003  DATE OF DISCHARGE:                                HISTORY & PHYSICAL   CHIEF COMPLAINT:  Right knee pain.   HISTORY OF PRESENT ILLNESS:  The patient is an 77 year old female who has  been seen by Dr. Homero Fellers Aluisio for right knee pain.  She is the mother of  patient, Hideko Esselman.  She comes in for evaluation of severe right knee  pain for approximately 3 years now.  She has been told in the past she had  arthritis, but her symptoms have progressively gotten worse to the point  where she has had difficulty getting around, and now having pain all day,  even at night.  She has previously undergone cortisone and Hyalgan  injections.  At this point, she continues to have pain despite conservative  treatment and injections.  Now she is at a point where she would like to  have something done about it.  Risks and benefits discussed.  She is found  to have severe end-stage arthritis on x-rays.  The patient would like to  proceed with surgery.   ALLERGIES:  No known drug allergies.   INTOLERANCES:  CODEINE makes the patient sick.   CURRENT MEDICATIONS:  1. Vasotec 10 mg one tablet twice a day.  2. Lanoxin 0.25 mg daily.  3. Hydrochlorothiazide 25 mg one-half tablet daily.  4. Coumadin 5 mg on Tuesday, Thursday, Saturday; 2.5 mg on Sunday, Monday,     Wednesday, Friday.  5. Lipitor 10 mg daily.  6. Tamoxifen 10 mg twice a day.  7. Toprol-XL 25 mg daily.   PAST MEDICAL HISTORY:  1. History of left facial stroke in 1996.  2. Paroxysmal atrial fibrillation.  3. Hypertension.  4. History of myocardial infarction in 1996.  5. Hypercholesterolemia.  6. Breast cancer.   PAST SURGICAL HISTORY:  1. Cardiac catheterization.  2. Hysterectomy.  3. Benign left breast biopsy in 1985.  4. Gallbladder surgery in 1989.  5. Benign right breast biopsy in 1994.  6. Left breast mastectomy in 2001 for breast cancer.   SOCIAL HISTORY:  Married.  Retired.  Nonsmoker.  Has 2 sons, one of whom is  deceased.  Husband will be assisting with the care after surgery.   FAMILY HISTORY:  Has a brother with heart disease.   REVIEW OF SYSTEMS:  GENERAL:  No fevers, chills, night sweats.  NEUROLOGIC:  No seizures, syncope, paralysis.  RESPIRATORY:  No shortness of breath,  productive cough, or hemoptysis.  CARDIOVASCULAR:  History of myocardial  infarction and atrial fibrillation.  Also  hypertension and high cholesterol.  No chest pain, angina, or orthopnea.  GI:  No nausea, vomiting, diarrhea, or  constipation.  GU:  No dysuria, hematuria, or discharge.  MUSCULOSKELETAL:  Pertinent to that of the knee found in the history of present illness.   PHYSICAL EXAMINATION:  VITAL SIGNS:  Pulse 60, respirations 14, blood  pressure 138/72.  GENERAL:  An 77 year old white female, tall, slender frame.  She is alert,  oriented, and cooperative.  Very pleasant at the time of my exam.  She is  accompanied by her husband.  HEENT:  Normocephalic and atraumatic.  Pupils equal, round and reactive.  Extraocular movements intact.  Oropharynx does show upper and partial lower  dentures noted.  NECK:  Supple.  No carotid bruits.  CHEST:  Clear anterior and posterior chest walls.  No rhonchi, rales, or  wheezing.  HEART:  Regular rate and rhythm with an occasional ectopic versus skipped  beat.  S1 and S2 are auscultated.  ABDOMEN:  Soft, flat, nontender.  Bowel sounds are present.  RECTAL/BREASTS/GENITALIA:  Not done.  Not pertinent to present illness.  EXTREMITIES:  Significant to the right lower extremity.  She does have a  slight varus malalignment deformity of the right knee, range of motion  of 5  to 105 degrees.  There is moderate crepitus on passive range of motion.   IMPRESSION:  1. Osteoarthritis, right knee.  2. Hypertension.  3. Paroxysmal atrial fibrillation.  4. History of myocardial infarction in 1996.  5. Hypercholesterolemia.  6. History of left facial stroke.  7. History of breast cancer, status post mastectomy.   PLAN:  The patient will be admitted to [facility] to undergo right  total knee arthroplasty.  The surgery will be performed by Dr.  Ollen Gross.     Alexzandrew L. Julien Girt, P.A.              Ollen Gross, M.D.    ALP/MEDQ  D:  08/28/2003  T:  08/28/2003  Job:  [id number]   cc:   Luanna Cole. Lenord Fellers, M.D.  [address], [place]  [place]  [place] [address]  Fax: [phone]   Peter M. [place], M.D.  [address]  [place], [place] [address]  Fax: [phone]

## 2010-06-30 ENCOUNTER — Other Ambulatory Visit: Payer: Self-pay | Admitting: Cardiology

## 2010-07-02 NOTE — Telephone Encounter (Signed)
escribe medication per fax request  

## 2010-07-11 ENCOUNTER — Ambulatory Visit (INDEPENDENT_AMBULATORY_CARE_PROVIDER_SITE_OTHER): Payer: Medicare Other | Admitting: *Deleted

## 2010-07-11 DIAGNOSIS — I4891 Unspecified atrial fibrillation: Secondary | ICD-10-CM

## 2010-07-20 ENCOUNTER — Other Ambulatory Visit: Payer: Self-pay | Admitting: Internal Medicine

## 2010-07-20 DIAGNOSIS — Z1231 Encounter for screening mammogram for malignant neoplasm of breast: Secondary | ICD-10-CM

## 2010-07-20 DIAGNOSIS — Z9012 Acquired absence of left breast and nipple: Secondary | ICD-10-CM

## 2010-07-26 ENCOUNTER — Encounter: Payer: Self-pay | Admitting: *Deleted

## 2010-07-28 ENCOUNTER — Other Ambulatory Visit: Payer: Self-pay | Admitting: Cardiology

## 2010-08-01 ENCOUNTER — Encounter: Payer: PRIVATE HEALTH INSURANCE | Admitting: *Deleted

## 2010-08-01 ENCOUNTER — Encounter: Payer: Self-pay | Admitting: Cardiology

## 2010-08-01 ENCOUNTER — Encounter: Payer: Self-pay | Admitting: *Deleted

## 2010-08-01 ENCOUNTER — Ambulatory Visit (INDEPENDENT_AMBULATORY_CARE_PROVIDER_SITE_OTHER): Payer: Medicare Other | Admitting: Cardiology

## 2010-08-01 ENCOUNTER — Ambulatory Visit (INDEPENDENT_AMBULATORY_CARE_PROVIDER_SITE_OTHER): Payer: Medicare Other | Admitting: *Deleted

## 2010-08-01 VITALS — BP 144/80 | HR 60 | Wt 134.0 lb

## 2010-08-01 DIAGNOSIS — I10 Essential (primary) hypertension: Secondary | ICD-10-CM

## 2010-08-01 DIAGNOSIS — Z7901 Long term (current) use of anticoagulants: Secondary | ICD-10-CM

## 2010-08-01 DIAGNOSIS — I4891 Unspecified atrial fibrillation: Secondary | ICD-10-CM

## 2010-08-01 LAB — BASIC METABOLIC PANEL
Calcium: 9.9 mg/dL (ref 8.4–10.5)
GFR: 61.22 mL/min (ref >60.00)
Sodium: 137 mEq/L (ref 135–145)

## 2010-08-01 NOTE — Assessment & Plan Note (Signed)
Blood pressure is well controlled on her current medical therapy. We will continue the same.

## 2010-08-01 NOTE — Progress Notes (Signed)
   [name] [name] Date of Birth: 06-28-1920   History of Present Illness: [name] [name] is seen today for followup. She states she is doing well. She has had no significant tachycardia or palpitations. She has had no recurrent TIA or CVA symptoms. Her husband reports that her blood pressure has been doing well.  Current Outpatient Prescriptions on File Prior to Visit  Medication Sig Dispense Refill  . ALPRAZolam (XANAX) 0.5 MG tablet Take 0.5 mg by mouth 2 (two) times daily.       Marland Kitchen amLODipine (NORVASC) 5 MG tablet TAKE 1 TABLET EVERY DAY  30 tablet  5  . dofetilide (TIKOSYN) 250 MCG capsule Take 250 mcg by mouth 2 (two) times daily.        Marland Kitchen KLOR-CON M20 20 MEQ tablet Take 1 tablet by mouth Daily.      Marland Kitchen losartan (COZAAR) 50 MG tablet TAKE 1 TABLET EVERY DAY  30 tablet  5  . multivitamin-iron-minerals-folic acid (CENTRUM) chewable tablet Chew 1 tablet by mouth daily.        Marland Kitchen OLANZapine (ZYPREXA) 5 MG tablet Take 2.5 mg by mouth at bedtime.        Marland Kitchen SYNTHROID 50 MCG tablet Take 1 tablet by mouth Daily.      Marland Kitchen warfarin (COUMADIN) 4 MG tablet Take as directed by the Coumadin Clinic        Allergies  Allergen Reactions  . Codeine   . Dilantin     Past Medical History  Diagnosis Date  . Atrial fibrillation   . Anxiety   . Hypertension   . Cerebrovascular accident   . Myocardial infarction   . Diverticula of colon   . Malignant neoplasm of breast     personal history of  . Anemia   . H/O: hysterectomy   . Iron deficiency   . Hyperlipidemia     Past Surgical History  Procedure Date  . Cholecystectomy   . Mastectomy     left breast  . Eye surgery     bil-cataract  . Total knee arthroplasty     History  Smoking status  . Never Smoker   Smokeless tobacco  . Never Used    History  Alcohol Use No    Family History  Problem Relation Age of Onset  . Coronary artery disease      family history of   . Heart disease      family history of     Review of  Systems: The review of systems is positive for chronic tremor and anxiety.  All other systems were reviewed and are negative.  Physical Exam: BP 144/80  Pulse 60  Wt 134 lb (60.782 kg) She is a pleasant elderly white female in no acute distress. Her HEENT exam is unremarkable. She has no JVD or bruits. Lungs are clear. Cardiac exam reveals a regular rate and rhythm without gallop, murmur, or click. Abdomen is soft and nontender. She has no masses or bruits. She has no edema. Pulses are 2+ and symmetric. She has a diffuse tremor. LABORATORY DATA: INR is 2.5. Basic metabolic panel is pending.  Assessment / Plan:

## 2010-08-01 NOTE — Assessment & Plan Note (Signed)
Her arrhythmia is well controlled on Tikosyn therapy. We will check her potassium today. I will follow up again in 6 months we will check an ECG at that time.

## 2010-08-01 NOTE — Patient Instructions (Signed)
We will check your potassium and kidney function today.  Continue your current medications.  I will see you back in 6 months.

## 2010-08-01 NOTE — Assessment & Plan Note (Signed)
INR is therapeutic today. We will check an INR again in 4 weeks.

## 2010-08-02 ENCOUNTER — Telehealth: Payer: Self-pay | Admitting: *Deleted

## 2010-08-02 NOTE — Telephone Encounter (Signed)
Notified of lab results. Will send copy to Dr. Lenord Fellers.

## 2010-08-02 NOTE — Telephone Encounter (Signed)
Message copied by Lorayne Bender on Thu Aug 02, 2010  4:00 PM ------      Message from: [place], PETER M      Created: Wed Aug 01, 2010  5:42 PM       Bmet is normal.

## 2010-08-06 ENCOUNTER — Other Ambulatory Visit: Payer: Self-pay | Admitting: Cardiology

## 2010-08-06 MED ORDER — ALPRAZOLAM 0.5 MG PO TABS: 0.5000 mg | ORAL_TABLET | Freq: Two times a day (BID) | ORAL | Status: DC

## 2010-08-06 NOTE — Telephone Encounter (Signed)
Wants to speak w/RN about his meds

## 2010-08-06 NOTE — Telephone Encounter (Signed)
Husband called stating ms. Murtaugh needs refill on Aprazolam. Will call in since Dr. [place] is not here.

## 2010-08-21 ENCOUNTER — Ambulatory Visit
Admission: RE | Admit: 2010-08-21 | Discharge: 2010-08-21 | Disposition: A | Discharge status: Home / Self Care | Payer: PRIVATE HEALTH INSURANCE | Source: Ambulatory Visit | Attending: Internal Medicine | Admitting: Internal Medicine

## 2010-08-21 DIAGNOSIS — Z9012 Acquired absence of left breast and nipple: Secondary | ICD-10-CM

## 2010-08-21 DIAGNOSIS — Z1231 Encounter for screening mammogram for malignant neoplasm of breast: Secondary | ICD-10-CM

## 2010-08-25 ENCOUNTER — Other Ambulatory Visit: Payer: Self-pay | Admitting: Internal Medicine

## 2010-08-29 ENCOUNTER — Ambulatory Visit (INDEPENDENT_AMBULATORY_CARE_PROVIDER_SITE_OTHER): Payer: Medicare Other | Admitting: *Deleted

## 2010-08-29 DIAGNOSIS — I4891 Unspecified atrial fibrillation: Secondary | ICD-10-CM

## 2010-09-21 ENCOUNTER — Encounter: Payer: Self-pay | Admitting: Internal Medicine

## 2010-09-24 ENCOUNTER — Ambulatory Visit (INDEPENDENT_AMBULATORY_CARE_PROVIDER_SITE_OTHER): Payer: Medicare Other | Admitting: Internal Medicine

## 2010-09-24 ENCOUNTER — Encounter: Payer: Self-pay | Admitting: Internal Medicine

## 2010-09-24 VITALS — BP 124/70 | HR 70 | Temp 97.0°F | Ht 62.0 in | Wt 133.0 lb

## 2010-09-24 DIAGNOSIS — E039 Hypothyroidism, unspecified: Secondary | ICD-10-CM
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
## 2010-09-25 ENCOUNTER — Encounter: Payer: Self-pay | Admitting: Internal Medicine

## 2010-09-29 ENCOUNTER — Other Ambulatory Visit: Payer: Self-pay | Admitting: Cardiology

## 2010-10-01 NOTE — Telephone Encounter (Signed)
escribe medication per fax request  

## 2010-10-02 ENCOUNTER — Encounter: Payer: Medicare Other | Admitting: *Deleted

## 2010-10-03 ENCOUNTER — Ambulatory Visit (INDEPENDENT_AMBULATORY_CARE_PROVIDER_SITE_OTHER): Payer: Medicare Other | Admitting: *Deleted

## 2010-10-03 ENCOUNTER — Encounter: Payer: Medicare Other | Admitting: *Deleted

## 2010-10-03 DIAGNOSIS — I4891 Unspecified atrial fibrillation: Secondary | ICD-10-CM

## 2010-10-20 ENCOUNTER — Other Ambulatory Visit: Payer: Self-pay | Admitting: Cardiology

## 2010-10-22 NOTE — Telephone Encounter (Signed)
Refilled Meds from fax  

## 2010-10-26 ENCOUNTER — Telehealth: Payer: Self-pay | Admitting: Cardiology

## 2010-10-26 NOTE — Telephone Encounter (Signed)
The patient's daughter in law is helping the patient ask a question about her EOB she received about a coumadin visit she had on 07/18.  She states the EOB says she was charged for a "short office visit".  Please call pt back to explain in better detail.

## 2010-10-27 ENCOUNTER — Other Ambulatory Visit: Payer: Self-pay | Admitting: Internal Medicine

## 2010-10-31 ENCOUNTER — Ambulatory Visit (INDEPENDENT_AMBULATORY_CARE_PROVIDER_SITE_OTHER): Payer: Medicare Other | Admitting: *Deleted

## 2010-10-31 DIAGNOSIS — I4891 Unspecified atrial fibrillation: Secondary | ICD-10-CM

## 2010-10-31 LAB — POCT INR: INR: 2.4

## 2010-11-01 LAB — CREATININE, SERUM
Creatinine, Ser: 0.77
GFR calc Af Amer: >60

## 2010-11-02 LAB — BASIC METABOLIC PANEL
BUN: 11
BUN: 12
BUN: 19
BUN: 22
BUN: 9
CO2: 24
CO2: 26
CO2: 26
CO2: 28
CO2: 30
Calcium: 8.6
Calcium: 8.7
Calcium: 8.9
Calcium: 9.3
Calcium: 9.3
Chloride: 100
Chloride: 99
Chloride: 99
Chloride: 99
Chloride: 99
Creatinine, Ser: 0.71
Creatinine, Ser: 0.74
Creatinine, Ser: 0.79
Creatinine, Ser: 0.8
Creatinine, Ser: 0.85
GFR calc Af Amer: >60
GFR calc Af Amer: >60
GFR calc Af Amer: >60
GFR calc Af Amer: >60
GFR calc Af Amer: >60
GFR calc non Af Amer: >60
GFR calc non Af Amer: >60
GFR calc non Af Amer: >60
GFR calc non Af Amer: >60
GFR calc non Af Amer: >60
Glucose, Bld: 104 — ABNORMAL HIGH
Glucose, Bld: 81
Glucose, Bld: 83
Glucose, Bld: 85
Glucose, Bld: 87
Potassium: 3.5
Potassium: 3.7
Potassium: 3.9
Potassium: 4.1
Potassium: 4.2
Sodium: 131 — ABNORMAL LOW
Sodium: 132 — ABNORMAL LOW
Sodium: 134 — ABNORMAL LOW
Sodium: 134 — ABNORMAL LOW
Sodium: 134 — ABNORMAL LOW

## 2010-11-02 LAB — CBC
HCT: 33.1 — ABNORMAL LOW
HCT: 33.5 — ABNORMAL LOW
HCT: 34.5 — ABNORMAL LOW
HCT: 35.6 — ABNORMAL LOW
HCT: 38.1
HCT: 36.4
Hemoglobin: 11.1 — ABNORMAL LOW
Hemoglobin: 11.1 — ABNORMAL LOW
Hemoglobin: 11.5 — ABNORMAL LOW
Hemoglobin: 11.9 — ABNORMAL LOW
Hemoglobin: 12.7
Hemoglobin: 12
MCHC: 33
MCHC: 33.3
MCHC: 33.4
MCHC: 33.4
MCHC: 33.5
MCV: 79
MCV: 79.2
MCV: 79.2
MCV: 79.6
MCV: 79.7
MCV: 80.6
Platelets: 110 — ABNORMAL LOW
Platelets: 111 — ABNORMAL LOW
Platelets: 112 — ABNORMAL LOW
Platelets: 132 — ABNORMAL LOW
Platelets: 96 — ABNORMAL LOW
Platelets: 206
RBC: 4.18
RBC: 4.21
RBC: 4.34
RBC: 4.51
RBC: 4.81
RBC: 4.52
RDW: 17.6 — ABNORMAL HIGH
RDW: 17.9 — ABNORMAL HIGH
RDW: 17.9 — ABNORMAL HIGH
RDW: 18.2 — ABNORMAL HIGH
RDW: 18.2 — ABNORMAL HIGH
WBC: 10.7 — ABNORMAL HIGH
WBC: 6
WBC: 6.1
WBC: 6.3
WBC: 7.8
WBC: 8.1

## 2010-11-02 LAB — CK TOTAL AND CKMB (NOT AT ARMC)
CK, MB: 1.7
Relative Index: INVALID
Total CK: 41

## 2010-11-02 LAB — URINALYSIS, ROUTINE W REFLEX MICROSCOPIC
Bilirubin Urine: NEGATIVE
Bilirubin Urine: NEGATIVE
Glucose, UA: NEGATIVE
Glucose, UA: NEGATIVE
Hgb urine dipstick: NEGATIVE
Ketones, ur: 15 — AB
Ketones, ur: NEGATIVE
Nitrite: NEGATIVE
Protein, ur: NEGATIVE
Specific Gravity, Urine: 1.018
Urobilinogen, UA: 1
pH: 6.5
pH: 6.5

## 2010-11-02 LAB — COMPREHENSIVE METABOLIC PANEL
ALT: 18
AST: 16
Albumin: 2.5 — ABNORMAL LOW
Alkaline Phosphatase: 67
Alkaline Phosphatase: 69
BUN: 11
BUN: 26 — ABNORMAL HIGH
BUN: 15
CO2: 25
CO2: 26
CO2: 27
Calcium: 8.6
Chloride: 96
Chloride: 100
Creatinine, Ser: 0.79
Creatinine, Ser: 0.75
GFR calc Af Amer: >60
GFR calc non Af Amer: 52 — ABNORMAL LOW
GFR calc non Af Amer: >60
GFR calc non Af Amer: >60
Glucose, Bld: 84
Glucose, Bld: 92
Glucose, Bld: 97
Potassium: 3.7
Potassium: 3.9
Sodium: 127 — ABNORMAL LOW
Total Bilirubin: 0.5
Total Bilirubin: 0.4
Total Protein: 5.2 — ABNORMAL LOW
Total Protein: 6.8

## 2010-11-02 LAB — DIFFERENTIAL
Basophils Absolute: 0.1
Basophils Absolute: 0
Basophils Relative: 1
Eosinophils Absolute: 0.2
Eosinophils Relative: 3
Lymphocytes Relative: 16
Lymphocytes Relative: 18
Lymphs Abs: 1
Monocytes Absolute: 0.4
Monocytes Relative: 7
Neutro Abs: 4.4
Neutro Abs: 5.3
Neutrophils Relative %: 73
Neutrophils Relative %: 66

## 2010-11-02 LAB — PROTIME-INR
INR: 1.9 — ABNORMAL HIGH
INR: 2 — ABNORMAL HIGH
INR: 2 — ABNORMAL HIGH
INR: 2.5 — ABNORMAL HIGH
INR: 2.6 — ABNORMAL HIGH
INR: 3.1 — ABNORMAL HIGH
INR: 3.6 — ABNORMAL HIGH
INR: 4.1 — ABNORMAL HIGH
INR: 4.8 — ABNORMAL HIGH
INR: 2.3 — ABNORMAL HIGH
Prothrombin Time: 22.1 — ABNORMAL HIGH
Prothrombin Time: 22.9 — ABNORMAL HIGH
Prothrombin Time: 23.7 — ABNORMAL HIGH
Prothrombin Time: 27.5 — ABNORMAL HIGH
Prothrombin Time: 28.5 — ABNORMAL HIGH
Prothrombin Time: 33.5 — ABNORMAL HIGH
Prothrombin Time: 37.5 — ABNORMAL HIGH
Prothrombin Time: 41.7 — ABNORMAL HIGH
Prothrombin Time: 47 — ABNORMAL HIGH
Prothrombin Time: 21.1 — ABNORMAL HIGH
Prothrombin Time: 25.8 — ABNORMAL HIGH
Prothrombin Time: 27.3 — ABNORMAL HIGH

## 2010-11-02 LAB — PROTEIN ELECTROPH W RFLX QUANT IMMUNOGLOBULINS
Albumin ELP: 52.7 — ABNORMAL LOW
Alpha-1-Globulin: 6.2 — ABNORMAL HIGH
Beta 2: 6.7 — ABNORMAL HIGH
Beta Globulin: 5.5
Gamma Globulin: 15.4

## 2010-11-02 LAB — CANCER ANTIGEN 27.29: CA 27.29: 9

## 2010-11-02 LAB — COMPREHENSIVE METABOLIC PANEL WITH GFR
ALT: 24
AST: 23
Albumin: 3.5
Calcium: 9.8
Chloride: 98
Creatinine, Ser: 1.02
GFR calc Af Amer: >60
Sodium: 132 — ABNORMAL LOW
Total Bilirubin: 0.7

## 2010-11-02 LAB — HEPATIC FUNCTION PANEL
ALT: 19
Bilirubin, Direct: 0.1
Indirect Bilirubin: 0.5
Total Protein: 6.4

## 2010-11-02 LAB — URINE CULTURE
Colony Count: >=100000
Special Requests: NEGATIVE

## 2010-11-02 LAB — APTT: aPTT: 30

## 2010-11-02 LAB — T4, FREE: Free T4: 1.8

## 2010-11-02 LAB — TSH: TSH: 4.134

## 2010-11-02 LAB — SEDIMENTATION RATE: Sed Rate: 21

## 2010-11-02 LAB — TROPONIN I: Troponin I: 0.04

## 2010-11-02 LAB — RPR: RPR Ser Ql: NONREACTIVE

## 2010-11-02 LAB — PHENYTOIN LEVEL, TOTAL
Phenytoin Lvl: 19.2
Phenytoin Lvl: 23.5 — ABNORMAL HIGH

## 2010-11-02 LAB — CEA: CEA: 0.5

## 2010-11-02 LAB — LIPASE, BLOOD: Lipase: 55

## 2010-11-02 LAB — URINE MICROSCOPIC-ADD ON

## 2010-11-02 LAB — VITAMIN B12: Vitamin B-12: 1486 — ABNORMAL HIGH (ref 211–911)

## 2010-11-26 LAB — CBC
HCT: 31.2 — ABNORMAL LOW
HCT: 33.8 — ABNORMAL LOW
Hemoglobin: 10 — ABNORMAL LOW
Hemoglobin: 10.8 — ABNORMAL LOW
MCHC: 32.1
MCV: 71.3 — ABNORMAL LOW
MCV: 72.3 — ABNORMAL LOW
Platelets: 381
Platelets: 439 — ABNORMAL HIGH
RBC: 4.22
RBC: 4.38
RBC: 4.67
RDW: 19.7 — ABNORMAL HIGH
RDW: 20.5 — ABNORMAL HIGH
WBC: 7.8
WBC: 8.1
WBC: 8.9
WBC: 9.9

## 2010-11-26 LAB — I-STAT 8, (EC8 V) (CONVERTED LAB)
Acid-Base Excess: 4 — ABNORMAL HIGH
BUN: 6
Bicarbonate: 26.8 — ABNORMAL HIGH
Chloride: 101
Glucose, Bld: 127 — ABNORMAL HIGH
HCT: 38
Hemoglobin: 12.9
Operator id: 279831
Potassium: 3.2 — ABNORMAL LOW
Sodium: 135
TCO2: 28
pCO2, Ven: 34.3 — ABNORMAL LOW
pH, Ven: 7.501 — ABNORMAL HIGH

## 2010-11-26 LAB — COMPREHENSIVE METABOLIC PANEL WITH GFR
AST: 17
Albumin: 2.9 — ABNORMAL LOW
Chloride: 102
Creatinine, Ser: 0.67
GFR calc Af Amer: >60
Potassium: 4.2
Total Bilirubin: 0.6
Total Protein: 6.5

## 2010-11-26 LAB — COMPREHENSIVE METABOLIC PANEL
ALT: 14
Alkaline Phosphatase: 59
BUN: 12
CO2: 27
Calcium: 9.3
GFR calc non Af Amer: >60
Glucose, Bld: 105 — ABNORMAL HIGH
Sodium: 134 — ABNORMAL LOW

## 2010-11-26 LAB — TSH: TSH: 3.024

## 2010-11-26 LAB — PROTIME-INR: INR: 1

## 2010-11-26 LAB — POCT I-STAT CREATININE
Creatinine, Ser: 0.7
Operator id: 279831

## 2010-11-26 LAB — POCT CARDIAC MARKERS
CKMB, poc: <1 — ABNORMAL LOW
Myoglobin, poc: 56.7
Operator id: 279831
Troponin i, poc: <0.05

## 2010-11-27 LAB — CBC
HCT: 35 — ABNORMAL LOW
HCT: 28.8 — ABNORMAL LOW
Hemoglobin: 10.9 — ABNORMAL LOW
Hemoglobin: 9.2 — ABNORMAL LOW
RBC: 4.84
RBC: 4.02
WBC: 8.5
WBC: 7.6

## 2010-11-27 LAB — I-STAT 8, (EC8 V) (CONVERTED LAB)
BUN: 5 — ABNORMAL LOW
Bicarbonate: 27.4 — ABNORMAL HIGH
Glucose, Bld: 108 — ABNORMAL HIGH
HCT: 39
Hemoglobin: 13.3
Operator id: 288831
Sodium: 136
TCO2: 28
pCO2, Ven: 30.8 — ABNORMAL LOW

## 2010-11-27 LAB — TYPE AND SCREEN
ABO/RH(D): O NEG
Antibody Screen: NEGATIVE

## 2010-11-27 LAB — DIFFERENTIAL
Basophils Absolute: 0.1
Basophils Relative: 1
Eosinophils Absolute: 0.3
Lymphocytes Relative: 17

## 2010-11-27 LAB — POCT CARDIAC MARKERS
CKMB, poc: <1 — ABNORMAL LOW
CKMB, poc: <1 — ABNORMAL LOW
Myoglobin, poc: 61
Troponin i, poc: <0.05

## 2010-11-27 LAB — APTT: aPTT: 58 — ABNORMAL HIGH

## 2010-11-27 LAB — MAGNESIUM: Magnesium: 1.9

## 2010-11-27 LAB — SAMPLE TO BLOOD BANK

## 2010-11-27 LAB — CALCIUM: Calcium: 9.1

## 2010-11-27 LAB — BASIC METABOLIC PANEL
Calcium: 9.6
GFR calc Af Amer: >60
GFR calc non Af Amer: >60
Potassium: 4.1
Sodium: 133 — ABNORMAL LOW

## 2010-11-27 LAB — PROTIME-INR
INR: 2.4 — ABNORMAL HIGH
Prothrombin Time: 14.4

## 2010-11-27 LAB — POCT I-STAT CREATININE: Operator id: 288831

## 2010-11-28 ENCOUNTER — Ambulatory Visit (INDEPENDENT_AMBULATORY_CARE_PROVIDER_SITE_OTHER): Payer: Medicare Other | Admitting: *Deleted

## 2010-11-28 DIAGNOSIS — I4891 Unspecified atrial fibrillation: Secondary | ICD-10-CM

## 2011-01-05 ENCOUNTER — Other Ambulatory Visit: Payer: Self-pay | Admitting: Cardiology

## 2011-01-08 ENCOUNTER — Other Ambulatory Visit: Payer: Self-pay | Admitting: Cardiology

## 2011-01-09 ENCOUNTER — Ambulatory Visit (INDEPENDENT_AMBULATORY_CARE_PROVIDER_SITE_OTHER): Payer: Medicare Other | Admitting: *Deleted

## 2011-01-09 DIAGNOSIS — I4891 Unspecified atrial fibrillation: Secondary | ICD-10-CM

## 2011-01-09 MED ORDER — LOSARTAN POTASSIUM 50 MG PO TABS: 50.0000 mg | ORAL_TABLET | Freq: Every day | ORAL | Status: DC

## 2011-01-09 NOTE — Telephone Encounter (Signed)
Addended by: Royanne Foots on: 01/09/2011 10:46 AM   Modules accepted: Orders

## 2011-01-10 MED ORDER — LOSARTAN POTASSIUM 50 MG PO TABS: 50.0000 mg | ORAL_TABLET | Freq: Every day | ORAL | Status: DC

## 2011-01-10 NOTE — Telephone Encounter (Signed)
escribe medication per fax request  

## 2011-01-10 NOTE — Telephone Encounter (Signed)
Fu call Pt just called pharmacy about losartan and they told him it has not been sent Please let him know

## 2011-01-12 ENCOUNTER — Other Ambulatory Visit: Payer: Self-pay | Admitting: Cardiology

## 2011-01-26 ENCOUNTER — Other Ambulatory Visit: Payer: Self-pay | Admitting: Cardiology

## 2011-02-26 ENCOUNTER — Encounter: Payer: Self-pay | Admitting: Cardiology

## 2011-02-26 ENCOUNTER — Ambulatory Visit (INDEPENDENT_AMBULATORY_CARE_PROVIDER_SITE_OTHER): Payer: Medicare Other | Admitting: *Deleted

## 2011-02-26 ENCOUNTER — Telehealth: Payer: Self-pay | Admitting: Cardiology

## 2011-02-26 ENCOUNTER — Ambulatory Visit (INDEPENDENT_AMBULATORY_CARE_PROVIDER_SITE_OTHER): Payer: Medicare Other | Admitting: Cardiology

## 2011-02-26 VITALS — BP 132/80 | HR 61 | Ht 63.0 in | Wt 138.0 lb

## 2011-02-26 DIAGNOSIS — I10 Essential (primary) hypertension: Secondary | ICD-10-CM

## 2011-02-26 DIAGNOSIS — Z7901 Long term (current) use of anticoagulants: Secondary | ICD-10-CM

## 2011-02-26 DIAGNOSIS — E039 Hypothyroidism, unspecified: Secondary | ICD-10-CM

## 2011-02-26 DIAGNOSIS — I4891 Unspecified atrial fibrillation: Secondary | ICD-10-CM

## 2011-02-26 LAB — CBC WITH DIFFERENTIAL/PLATELET
Basophils Absolute: 0 10*3/uL (ref 0.0–0.1)
Basophils Relative: 0.6 % (ref 0.0–3.0)
Eosinophils Absolute: 0.3 10*3/uL (ref 0.0–0.7)
Lymphocytes Relative: 20.4 % (ref 12.0–46.0)
MCHC: 33.9 g/dL (ref 30.0–36.0)
MCV: 86.9 fl (ref 78.0–100.0)
Monocytes Absolute: 0.5 10*3/uL (ref 0.1–1.0)
Neutrophils Relative %: 67.7 % (ref 43.0–77.0)
RBC: 4.59 Mil/uL (ref 3.87–5.11)
RDW: 14.8 % — ABNORMAL HIGH (ref 11.5–14.6)

## 2011-02-26 LAB — BASIC METABOLIC PANEL
BUN: 25 mg/dL — ABNORMAL HIGH (ref 6–23)
CO2: 28 mEq/L (ref 19–32)
Calcium: 9.8 mg/dL (ref 8.4–10.5)
Chloride: 105 mEq/L (ref 96–112)
Creatinine, Ser: 1.1 mg/dL (ref 0.4–1.2)
Glucose, Bld: 93 mg/dL (ref 70–99)

## 2011-02-26 LAB — POCT INR: INR: 2.8

## 2011-02-26 NOTE — Patient Instructions (Signed)
We will check lab work today and call with the results.  Continue your current medications.  I will see you again in 6 months.

## 2011-02-26 NOTE — Assessment & Plan Note (Signed)
Her atrial arrhythmia has been well controlled on Tikosyn. Her QT interval today is normal. We also check her electrolytes which were normal. Sounds like she had some minor arrhythmia yesterday but did not have a significant tachycardia. I made no changes in her therapy.

## 2011-02-26 NOTE — Telephone Encounter (Signed)
Patient's son called wanting to make Dr.Jordan aware patient had episode of fast heart beat yesterday.Dr.Jordan was told by patient.Advised to continue medication.

## 2011-02-26 NOTE — Assessment & Plan Note (Signed)
She is therapeutic on her current Coumadin dose. She will continue followup in our Coumadin clinic.

## 2011-02-26 NOTE — Progress Notes (Signed)
[name] [name] Date of Birth: 26-Aug-1920   History of Present Illness: [name] [name] is seen today for followup. She reports symptoms of palpitations yesterday. Her husband reports that her pulse felt irregular but that the heart rate was not over 100 beats per minute. She's had no TIA or CVA symptoms. She is tolerating her medications well.  Current Outpatient Prescriptions on File Prior to Visit  Medication Sig Dispense Refill  . ALPRAZolam (XANAX) 0.5 MG tablet Take 1 tablet (0.5 mg total) by mouth 2 (two) times daily.  60 tablet  5  . KLOR-CON M20 20 MEQ tablet TAKE 1 TABLET BY MOUTH EVERY DAY  30 tablet  5  . losartan (COZAAR) 50 MG tablet Take 1 tablet (50 mg total) by mouth daily.  30 tablet  5  . multivitamin-iron-minerals-folic acid (CENTRUM) chewable tablet Chew 1 tablet by mouth daily.        . NORVASC 5 MG tablet TAKE 1 TABLET EVERY DAY  30 tablet  5  . OLANZapine (ZYPREXA) 5 MG tablet Take 2.5 mg by mouth at bedtime.        Marland Kitchen SYNTHROID 50 MCG tablet TAKE 1 TABLET BY MOUTH EVERY DAY  30 tablet  5  . TIKOSYN 250 MCG capsule TAKE ONE CAPSULE BY MOUTH TWICE A DAY  60 capsule  5  . warfarin (COUMADIN) 4 MG tablet Take as directed by the Coumadin Clinic      . warfarin (COUMADIN) 5 MG tablet Take as directed by anticoagulation clinic  30 tablet  3    Allergies  Allergen Reactions  . Codeine   . Dilantin   . Lexapro Other (See Comments)    Adverse reaction    Past Medical History  Diagnosis Date  . Atrial fibrillation   . Anxiety   . Hypertension   . Cerebrovascular accident   . Myocardial infarction   . Diverticula of colon   . Malignant neoplasm of breast     personal history of  . Anemia   . H/O: hysterectomy   . Iron deficiency   . Hyperlipidemia     Past Surgical History  Procedure Date  . Cholecystectomy   . Mastectomy     left breast  . Eye surgery     bil-cataract  . Total knee arthroplasty     History  Smoking status  . Never Smoker     Smokeless tobacco  . Never Used    History  Alcohol Use No    Family History  Problem Relation Age of Onset  . Coronary artery disease      family history of   . Heart disease      family history of     Review of Systems: The review of systems is positive for chronic tremor and anxiety.  All other systems were reviewed and are negative.  Physical Exam: BP 132/80  Pulse 61  Ht 5\' 3"  (1.6 m)  Wt 138 lb (62.596 kg)  BMI 24.45 kg/m2 She is a pleasant elderly white female in no acute distress. Her HEENT exam is unremarkable. She has no JVD or bruits. Lungs are clear. Cardiac exam reveals a regular rate and rhythm without gallop, murmur, or click. Abdomen is soft and nontender. She has no masses or bruits. She has no edema. Pulses are 2+ and symmetric. She has a diffuse tremor. LABORATORY DATA: INR is 2.8. ECG today demonstrates normal sinus rhythm with a rate of 61 beats per minute with a normal  ECG.  Assessment / Plan:

## 2011-02-26 NOTE — Assessment & Plan Note (Signed)
Blood pressure is well controlled on current therapy 

## 2011-02-26 NOTE — Telephone Encounter (Signed)
New msg: Pt son calling wanting to speak with nurse regarding pt appt today. Please return pt son call to discuss further.

## 2011-04-01 ENCOUNTER — Other Ambulatory Visit: Payer: Self-pay | Admitting: *Deleted

## 2011-04-01 MED ORDER — WARFARIN SODIUM 4 MG PO TABS: 4.0000 mg | ORAL_TABLET | ORAL | Status: DC

## 2011-04-04 ENCOUNTER — Encounter: Payer: Self-pay | Admitting: Internal Medicine

## 2011-04-04 ENCOUNTER — Ambulatory Visit (INDEPENDENT_AMBULATORY_CARE_PROVIDER_SITE_OTHER): Payer: Medicare Other | Admitting: Internal Medicine

## 2011-04-04 VITALS — BP 140/72 | HR 68 | Ht 61.5 in | Wt 143.0 lb

## 2011-04-04 DIAGNOSIS — J309 Allergic rhinitis, unspecified: Secondary | ICD-10-CM

## 2011-04-04 DIAGNOSIS — D649 Anemia, unspecified: Secondary | ICD-10-CM

## 2011-04-04 DIAGNOSIS — F419 Anxiety disorder, unspecified: Secondary | ICD-10-CM

## 2011-04-04 DIAGNOSIS — E039 Hypothyroidism, unspecified: Secondary | ICD-10-CM

## 2011-04-04 DIAGNOSIS — I10 Essential (primary) hypertension: Secondary | ICD-10-CM

## 2011-04-04 DIAGNOSIS — D126 Benign neoplasm of colon, unspecified: Secondary | ICD-10-CM

## 2011-04-04 DIAGNOSIS — F0390 Unspecified dementia without behavioral disturbance: Secondary | ICD-10-CM

## 2011-04-04 DIAGNOSIS — H3530 Unspecified macular degeneration: Secondary | ICD-10-CM

## 2011-04-04 DIAGNOSIS — Z1211 Encounter for screening for malignant neoplasm of colon: Secondary | ICD-10-CM

## 2011-04-04 DIAGNOSIS — K635 Polyp of colon: Secondary | ICD-10-CM

## 2011-04-04 DIAGNOSIS — I4891 Unspecified atrial fibrillation: Secondary | ICD-10-CM

## 2011-04-04 DIAGNOSIS — E559 Vitamin D deficiency, unspecified: Secondary | ICD-10-CM

## 2011-04-04 DIAGNOSIS — F411 Generalized anxiety disorder: Secondary | ICD-10-CM

## 2011-04-04 DIAGNOSIS — Z853 Personal history of malignant neoplasm of breast: Secondary | ICD-10-CM

## 2011-04-04 DIAGNOSIS — E785 Hyperlipidemia, unspecified: Secondary | ICD-10-CM

## 2011-04-04 LAB — POCT URINALYSIS DIPSTICK
Bilirubin, UA: NEGATIVE
Blood, UA: NEGATIVE
Nitrite, UA: NEGATIVE
Protein, UA: NEGATIVE
Urobilinogen, UA: NEGATIVE
pH, UA: 7

## 2011-04-05 LAB — LIPID PANEL
HDL: 64 mg/dL (ref >39)
LDL Cholesterol: 161 mg/dL — ABNORMAL HIGH (ref 0–99)
Total CHOL/HDL Ratio: 3.9 Ratio
VLDL: 27 mg/dL (ref 0–40)

## 2011-04-05 LAB — CBC WITH DIFFERENTIAL/PLATELET
Basophils Absolute: 0.1 10*3/uL (ref 0.0–0.1)
Eosinophils Absolute: 0.4 10*3/uL (ref 0.0–0.7)
Eosinophils Relative: 5 % (ref 0–5)
Lymphocytes Relative: 29 % (ref 12–46)
MCH: 28.1 pg (ref 26.0–34.0)
MCV: 90.4 fL (ref 78.0–100.0)
Neutrophils Relative %: 58 % (ref 43–77)
Platelets: 316 10*3/uL (ref 150–400)
RDW: 14.9 % (ref 11.5–15.5)
WBC: 7.4 10*3/uL (ref 4.0–10.5)

## 2011-04-05 LAB — COMPREHENSIVE METABOLIC PANEL
ALT: 14 U/L (ref 0–35)
AST: 17 U/L (ref 0–37)
Alkaline Phosphatase: 84 U/L (ref 39–117)
Creat: 0.79 mg/dL (ref 0.50–1.10)
Sodium: 141 mEq/L (ref 135–145)
Total Bilirubin: 0.5 mg/dL (ref 0.3–1.2)

## 2011-04-05 LAB — VITAMIN D 25 HYDROXY (VIT D DEFICIENCY, FRACTURES): Vit D, 25-Hydroxy: 34 ng/mL (ref 30–89)

## 2011-04-09 ENCOUNTER — Ambulatory Visit (INDEPENDENT_AMBULATORY_CARE_PROVIDER_SITE_OTHER): Payer: Medicare Other | Admitting: Pharmacist

## 2011-04-09 DIAGNOSIS — I4891 Unspecified atrial fibrillation: Secondary | ICD-10-CM

## 2011-04-11 ENCOUNTER — Telehealth: Payer: Self-pay | Admitting: Cardiology

## 2011-04-11 NOTE — Telephone Encounter (Signed)
Pt's husband calling to see if ok that she get a shingle shot?

## 2011-04-11 NOTE — Telephone Encounter (Signed)
She is ok to get the shingles vaccine [name] Vanderhoof [place] MD, [facility]

## 2011-04-11 NOTE — Telephone Encounter (Signed)
Pt notified that message will be sent to Dr [place] for an ok.  She is aware that he is not in the [address] office today.

## 2011-04-12 NOTE — Telephone Encounter (Signed)
Patient called was told okay with Dr.Jordan to have shingles vaccine.

## 2011-04-15 ENCOUNTER — Telehealth: Payer: Self-pay | Admitting: Internal Medicine

## 2011-04-16 NOTE — Telephone Encounter (Signed)
I am fine with restarting cholesterol meds in face of heart issues however previously there was concern about expense of meds. OK to taper Zyprexa and stop.

## 2011-04-17 ENCOUNTER — Other Ambulatory Visit: Payer: Self-pay | Admitting: Dermatology

## 2011-04-18 ENCOUNTER — Other Ambulatory Visit: Payer: Self-pay

## 2011-04-18 ENCOUNTER — Telehealth: Payer: Self-pay | Admitting: Cardiology

## 2011-04-18 NOTE — Telephone Encounter (Signed)
Son called yesterday about whether or not patient needs to be on lipid-lowering medication. He noted that recent lab work showed her cholesterol to be elevated. She is to be on Lipitor generic 10 mg daily since November 2010. At this point at 78 years of age I am not sure it is doing very much but will defer to Dr. [place]. Explained this today in lengthy phone call to son. She still has issues with affect. They want to stop Zyprexa. They plan to taper that off. Husband is been concerned about cost of medications but son assures me the cost is not an issue. I do think she should continue with Xanax for nail since she appears to frequently be agitated. She refuses to see psychiatrist. Refuses to be evaluated any further elsewhere. Doesn't like to go out of the house very much. Perseverates a lot about not having any clothing to wear. I think there is an element of dementia going on. Explained this to son over the phone tonight. He was not with her at last visit. Daughter-in-law brought her to see me at last visit.

## 2011-04-18 NOTE — Telephone Encounter (Signed)
[facility] [address], [facility] refill needed

## 2011-04-19 ENCOUNTER — Telehealth: Payer: Self-pay | Admitting: Cardiology

## 2011-04-19 MED ORDER — POTASSIUM CHLORIDE CRYS ER 20 MEQ PO TBCR: 20.0000 meq | EXTENDED_RELEASE_TABLET | Freq: Every day | ORAL | Status: DC

## 2011-04-19 NOTE — Telephone Encounter (Signed)
Pt's son needs to talk with nurse re cholesterol med, was on lipitor 10mg , was taken off by pcp, last blood ck, cholesterol high again, pcp suggested son speak to dr [place] to see if he wants her to go back on med or due to age not medicate her, pls call, uses   [facility] [address]

## 2011-04-19 NOTE — Telephone Encounter (Signed)
04/19/11-LMTCB--NT

## 2011-04-21 NOTE — Telephone Encounter (Signed)
I would leave her off statin for now. Kollin Udell [place] MD, [facility]

## 2011-04-23 NOTE — Telephone Encounter (Signed)
Patient's son Harvie Heck called was told Dr.Jordan advised to leave off statin for now.

## 2011-04-27 ENCOUNTER — Other Ambulatory Visit: Payer: Self-pay | Admitting: Internal Medicine

## 2011-04-29 ENCOUNTER — Other Ambulatory Visit: Payer: Self-pay

## 2011-04-29 ENCOUNTER — Telehealth: Payer: Self-pay | Admitting: Cardiology

## 2011-04-29 NOTE — Telephone Encounter (Signed)
Please return call to Patient son [name] [name] [phone]  Patient having troubles with allergies, son [name] [name] wonders if it is ok for her to have Claritan.  [name] [name] can be reached at [phone].

## 2011-04-29 NOTE — Telephone Encounter (Signed)
04/29/11--son calling wanting to know what his mom can take for allergies--LM on identified voice mail that any type allergy med without D (pseudophedrine) can be taken and i told him to talk with pharmacist before buying OTC--also advised if he did not understand my message he should call back--nt

## 2011-04-30 ENCOUNTER — Telehealth: Payer: Self-pay | Admitting: Cardiology

## 2011-04-30 ENCOUNTER — Other Ambulatory Visit: Payer: Self-pay | Admitting: *Deleted

## 2011-04-30 MED ORDER — DOFETILIDE 250 MCG PO CAPS: 250.0000 ug | ORAL_CAPSULE | Freq: Two times a day (BID) | ORAL | Status: DC

## 2011-04-30 NOTE — Telephone Encounter (Signed)
REFILLED MEDICATION 

## 2011-04-30 NOTE — Telephone Encounter (Signed)
Pt's husband calling for refill of tikosyn to [facility] [facility] [address], pt out needs called in today

## 2011-05-14 ENCOUNTER — Encounter: Payer: Self-pay | Admitting: Internal Medicine

## 2011-05-14 DIAGNOSIS — E785 Hyperlipidemia, unspecified: Secondary | ICD-10-CM | POA: Insufficient documentation

## 2011-05-14 DIAGNOSIS — H3530 Unspecified macular degeneration: Secondary | ICD-10-CM | POA: Insufficient documentation

## 2011-05-14 DIAGNOSIS — F0390 Unspecified dementia without behavioral disturbance: Secondary | ICD-10-CM | POA: Insufficient documentation

## 2011-05-14 DIAGNOSIS — J309 Allergic rhinitis, unspecified: Secondary | ICD-10-CM | POA: Insufficient documentation
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
## 2011-05-14 NOTE — Patient Instructions (Signed)
Continue same medications and return in 6-12 months or sooner if necessary.

## 2011-05-21 ENCOUNTER — Ambulatory Visit (INDEPENDENT_AMBULATORY_CARE_PROVIDER_SITE_OTHER): Payer: Medicare Other | Admitting: Pharmacist

## 2011-05-21 DIAGNOSIS — I4891 Unspecified atrial fibrillation: Secondary | ICD-10-CM

## 2011-06-11 ENCOUNTER — Other Ambulatory Visit: Payer: Self-pay | Admitting: Internal Medicine

## 2011-06-18 ENCOUNTER — Ambulatory Visit (INDEPENDENT_AMBULATORY_CARE_PROVIDER_SITE_OTHER): Payer: Medicare Other | Admitting: Pharmacist

## 2011-06-18 DIAGNOSIS — I4891 Unspecified atrial fibrillation: Secondary | ICD-10-CM

## 2011-06-18 LAB — POCT INR: INR: 2.7

## 2011-07-16 ENCOUNTER — Ambulatory Visit (INDEPENDENT_AMBULATORY_CARE_PROVIDER_SITE_OTHER): Payer: Medicare Other | Admitting: Pharmacist

## 2011-07-16 DIAGNOSIS — I4891 Unspecified atrial fibrillation: Secondary | ICD-10-CM

## 2011-07-22 ENCOUNTER — Other Ambulatory Visit: Payer: Self-pay | Admitting: Internal Medicine

## 2011-07-22 DIAGNOSIS — Z1231 Encounter for screening mammogram for malignant neoplasm of breast: Secondary | ICD-10-CM

## 2011-07-22 DIAGNOSIS — Z9012 Acquired absence of left breast and nipple: Secondary | ICD-10-CM

## 2011-07-29 ENCOUNTER — Other Ambulatory Visit: Payer: Self-pay

## 2011-07-29 MED ORDER — AMLODIPINE BESYLATE 5 MG PO TABS: 5.0000 mg | ORAL_TABLET | Freq: Every day | ORAL | Status: DC

## 2011-08-12 ENCOUNTER — Other Ambulatory Visit: Payer: Self-pay | Admitting: Cardiology

## 2011-08-12 MED ORDER — WARFARIN SODIUM 5 MG PO TABS: ORAL_TABLET | ORAL | Status: DC

## 2011-08-12 MED ORDER — LOSARTAN POTASSIUM 50 MG PO TABS: 50.0000 mg | ORAL_TABLET | Freq: Every day | ORAL | Status: DC

## 2011-08-13 ENCOUNTER — Ambulatory Visit (INDEPENDENT_AMBULATORY_CARE_PROVIDER_SITE_OTHER): Payer: Medicare Other | Admitting: *Deleted

## 2011-08-13 DIAGNOSIS — I4891 Unspecified atrial fibrillation: Secondary | ICD-10-CM

## 2011-08-27 ENCOUNTER — Ambulatory Visit
Admission: RE | Admit: 2011-08-27 | Discharge: 2011-08-27 | Disposition: A | Discharge status: Home / Self Care | Payer: Medicare Other | Source: Ambulatory Visit | Attending: Internal Medicine | Admitting: Internal Medicine

## 2011-08-27 DIAGNOSIS — Z9012 Acquired absence of left breast and nipple: Secondary | ICD-10-CM

## 2011-08-27 DIAGNOSIS — Z1231 Encounter for screening mammogram for malignant neoplasm of breast: Secondary | ICD-10-CM

## 2011-09-24 ENCOUNTER — Ambulatory Visit (INDEPENDENT_AMBULATORY_CARE_PROVIDER_SITE_OTHER): Payer: Medicare Other | Admitting: *Deleted

## 2011-09-24 DIAGNOSIS — I4891 Unspecified atrial fibrillation: Secondary | ICD-10-CM

## 2011-09-24 LAB — POCT INR: INR: 3

## 2011-10-16 ENCOUNTER — Other Ambulatory Visit: Payer: Self-pay | Admitting: *Deleted

## 2011-10-16 MED ORDER — POTASSIUM CHLORIDE CRYS ER 20 MEQ PO TBCR: 20.0000 meq | EXTENDED_RELEASE_TABLET | Freq: Every day | ORAL | Status: DC

## 2011-10-22 ENCOUNTER — Encounter (HOSPITAL_COMMUNITY): Payer: Self-pay | Admitting: Family Medicine

## 2011-10-22 ENCOUNTER — Emergency Department (HOSPITAL_COMMUNITY)
Admission: EM | Admit: 2011-10-22 | Discharge: 2011-10-22 | Disposition: A | Discharge status: Home / Self Care | Payer: Medicare Other | Attending: Emergency Medicine | Admitting: Emergency Medicine

## 2011-10-22 ENCOUNTER — Other Ambulatory Visit: Payer: Self-pay | Admitting: Cardiology

## 2011-10-22 DIAGNOSIS — I4891 Unspecified atrial fibrillation: Secondary | ICD-10-CM | POA: Insufficient documentation

## 2011-10-22 DIAGNOSIS — F411 Generalized anxiety disorder: Secondary | ICD-10-CM | POA: Insufficient documentation

## 2011-10-22 DIAGNOSIS — Z79899 Other long term (current) drug therapy: Secondary | ICD-10-CM | POA: Insufficient documentation

## 2011-10-22 DIAGNOSIS — E785 Hyperlipidemia, unspecified: Secondary | ICD-10-CM | POA: Insufficient documentation

## 2011-10-22 DIAGNOSIS — Z7901 Long term (current) use of anticoagulants: Secondary | ICD-10-CM | POA: Insufficient documentation

## 2011-10-22 DIAGNOSIS — I10 Essential (primary) hypertension: Secondary | ICD-10-CM | POA: Insufficient documentation

## 2011-10-22 DIAGNOSIS — Z853 Personal history of malignant neoplasm of breast: Secondary | ICD-10-CM | POA: Insufficient documentation

## 2011-10-22 DIAGNOSIS — D509 Iron deficiency anemia, unspecified: Secondary | ICD-10-CM | POA: Insufficient documentation

## 2011-10-22 DIAGNOSIS — F419 Anxiety disorder, unspecified: Secondary | ICD-10-CM

## 2011-10-22 MED ORDER — LORAZEPAM 2 MG/ML IJ SOLN
1.0000 mg | Freq: Once | INTRAMUSCULAR | Status: AC
  Administered 2011-10-22: 1 mg via INTRAVENOUS
  Filled 2011-10-22: qty 1

## 2011-10-22 MED ORDER — DOFETILIDE 250 MCG PO CAPS: 250.0000 ug | ORAL_CAPSULE | Freq: Two times a day (BID) | ORAL | Status: DC

## 2011-10-22 NOTE — ED Notes (Signed)
Pt ambulated to restroom with 1 assist. Pt's husband concern about pt's temperature, temp was checked-  99.1

## 2011-10-22 NOTE — ED Provider Notes (Signed)
History     CSN: [id number]  Arrival date & time 10/22/11  1405   First MD Initiated Contact with Patient 10/22/11 1621      Chief Complaint  Patient presents with  . Anxiety  . Atrial Fibrillation    (Consider location/radiation/quality/duration/timing/severity/associated sxs/prior treatment) HPI Comments: Patients with history of atrial fib, on Coumadin, anxiety -- presents with complaint of shaking. Patient's husband is at bedside and states that patient has anxiety and when she gets anxiety attacks she shakes. Patient states that she does not feel anxious and that the shaking is worse than usual. Patient did not take her prescribed Xanax. Symptoms started approximately lunchtime after she ate. Patient states it feels like she can't get a full breath of air. She denies chest pain. She denies nausea or vomiting, abdominal pain. No weakness in arms or legs. No trouble walking with her walker. She denies headache or vision changes. Onset was acute. Course is constant. Nothing makes symptoms better or worse.  The history is provided by the patient, medical records and the spouse.    Past Medical History  Diagnosis Date  . Atrial fibrillation   . Anxiety   . Hypertension   . Cerebrovascular accident   . Myocardial infarction   . Diverticula of colon   . Malignant neoplasm of breast     personal history of  . Anemia   . H/O: hysterectomy   . Iron deficiency   . Hyperlipidemia     Past Surgical History  Procedure Date  . Cholecystectomy   . Mastectomy     left breast  . Eye surgery     bil-cataract  . Total knee arthroplasty     Family History  Problem Relation Age of Onset  . Coronary artery disease      family history of   . Heart disease      family history of     History  Substance Use Topics  . Smoking status: Never Smoker   . Smokeless tobacco: Never Used  . Alcohol Use: No    OB History    Grav Para Term Preterm Abortions TAB SAB Ect Mult Living               Review of Systems  Constitutional: Negative for fever.  HENT: Negative for sore throat and rhinorrhea.   Eyes: Negative for redness.  Respiratory: Positive for shortness of breath. Negative for cough.   Cardiovascular: Negative for chest pain.  Gastrointestinal: Negative for nausea, vomiting, abdominal pain and diarrhea.  Genitourinary: Negative for dysuria.  Musculoskeletal: Negative for myalgias.  Skin: Negative for rash.  Neurological: Negative for headaches.  Psychiatric/Behavioral: The patient is nervous/anxious.     Allergies  Codeine; Escitalopram oxalate; and Phenytoin sodium extended  Home Medications   Current Outpatient Rx  Name Route Sig Dispense Refill  . ALPRAZOLAM 0.5 MG PO TABS Oral Take 0.25 mg by mouth 2 (two) times daily.    Marland Kitchen AMLODIPINE BESYLATE 5 MG PO TABS Oral Take 5 mg by mouth daily.    . DOFETILIDE 250 MCG PO CAPS Oral Take 250 mcg by mouth 2 (two) times daily.    Marland Kitchen LEVOTHYROXINE SODIUM 50 MCG PO TABS Oral Take 50 mcg by mouth daily.    Marland Kitchen LOSARTAN POTASSIUM 50 MG PO TABS Oral Take 50 mg by mouth daily.    . CENTRUM PO CHEW Oral Chew 1 tablet by mouth daily.      Marland Kitchen OLANZAPINE 5 MG PO TABS Oral  Take 5 mg by mouth at bedtime.    Marland Kitchen POTASSIUM CHLORIDE CRYS ER 20 MEQ PO TBCR Oral Take 20 mEq by mouth daily.    . WARFARIN SODIUM 4 MG PO TABS Oral Take 4 mg by mouth 3 (three) times a week. Takes 4 mg on Mon, Weds, & Fri    . WARFARIN SODIUM 5 MG PO TABS Oral Take 5 mg by mouth 4 (four) times a week. Takes 5 mg Tues, Thurs, Sat, & Sun      BP 169/89  Pulse 84  Temp 99.1 F (37.3 C) (Oral)  Resp 22  SpO2 93%  Physical Exam  Nursing note and vitals reviewed. Constitutional: She appears well-developed and well-nourished.  HENT:  Head: Normocephalic and atraumatic.  Eyes: Conjunctivae are normal. Right eye exhibits no discharge. Left eye exhibits no discharge.  Neck: Normal range of motion. Neck supple.  Cardiovascular: Normal rate, regular  rhythm and normal heart sounds.   No murmur heard. Pulmonary/Chest: Effort normal and breath sounds normal. No respiratory distress.  Abdominal: Soft. There is no tenderness.  Neurological: She is alert.  Skin: Skin is warm and dry.  Psychiatric: She has a normal mood and affect.    ED Course  Procedures (including critical care time)  Labs Reviewed - No data to display No results found.   1. Anxiety     4:33 PM Patient seen and examined. EKG reviewed. Medications ordered.   Vital signs reviewed and are as follows: Filed Vitals:   10/22/11 1545  BP:   Pulse:   Temp: 99.1 F (37.3 C)  Resp:   BP 169/89  Pulse 84  Temp 99.1 F (37.3 C) (Oral)  Resp 22  SpO2 93%   Date: 10/22/2011  Rate: 83  Rhythm: normal sinus rhythm, PAC  QRS Axis: normal  Intervals: normal  ST/T Wave abnormalities: nonspecific T wave changes  Conduction Disutrbances:none  Narrative Interpretation:   Old EKG Reviewed: unchanged from 10/07/2009  5:41 PM Patient mildly tremulous but much improved after ativan. Dr. Preston Fleeting has seen. Family advised to take additional dose of xanax if severe symptoms occur in future. Will discharge.    MDM  Patient tremulous/anxious, improved with anxiolytics. She has normal neurological exam. She had mild shortness of breath with anxiety however do not suspect PE or other pulmonary etiology. Do not suspect cardiac or neurological etiology. Patient is improved with treatment in emergency department. No further workup indicated at this time.        Renne Crigler, [place] 10/22/11 1752

## 2011-10-22 NOTE — ED Provider Notes (Signed)
77 year old female came in because of feeling very shaky today. This started after lunch. She has problems with feeling shaky and nervous but this was somewhat worse than she normally has. She is supposed to take an extra dose of her alprazolam and she has a flareup like this, but she couldn't remember which medication she was supposed to take if things get worse. In the emergency department, she was given a dose of lorazepam and is back to her baseline. On exam, she still is somewhat tremulous, but her son is with her and states that this is at her baseline and he confirms that she had significant improvement almost immediately after getting lorazepam. She will be discharged with instructions to followup with her PCP.   Date: 10/22/2011  Rate: 83  Rhythm: normal sinus rhythm and premature ventricular contractions (PVC)  QRS Axis: normal  Intervals: normal  ST/T Wave abnormalities: nonspecific T wave changes  Conduction Disutrbances:none  Narrative Interpretation: Minor nonspecific T wave changes. When compared with ECG of 10/07/2009, no significant changes are seen.  Old EKG Reviewed: unchanged    Dione Booze, MD 10/22/11 1739

## 2011-10-22 NOTE — ED Notes (Signed)
Per EMS, after lunch patient became anxious and felt like her heart was going to bust out of her chest. Pt NSR on the monitor. Denies chest pain. Pt very shaky

## 2011-10-23 NOTE — ED Provider Notes (Signed)
Medical screening examination/treatment/procedure(s) were conducted as a shared visit with non-physician practitioner(s) and myself.  I personally evaluated the patient during the encounter   Brevon Dewald, MD 10/23/11 0223 

## 2011-10-26 ENCOUNTER — Other Ambulatory Visit: Payer: Self-pay | Admitting: Internal Medicine

## 2011-11-05 ENCOUNTER — Ambulatory Visit (INDEPENDENT_AMBULATORY_CARE_PROVIDER_SITE_OTHER): Payer: Medicare Other | Admitting: *Deleted

## 2011-11-05 DIAGNOSIS — I4891 Unspecified atrial fibrillation: Secondary | ICD-10-CM

## 2011-11-05 LAB — POCT INR: INR: 2.7

## 2011-11-29 ENCOUNTER — Emergency Department (HOSPITAL_COMMUNITY): Payer: Medicare Other

## 2011-11-29 ENCOUNTER — Encounter (HOSPITAL_COMMUNITY): Payer: Self-pay | Admitting: *Deleted

## 2011-11-29 ENCOUNTER — Emergency Department (HOSPITAL_COMMUNITY)
Admission: EM | Admit: 2011-11-29 | Discharge: 2011-11-29 | Disposition: A | Discharge status: Home / Self Care | Payer: Medicare Other | Attending: Emergency Medicine | Admitting: Emergency Medicine

## 2011-11-29 DIAGNOSIS — F419 Anxiety disorder, unspecified: Secondary | ICD-10-CM | POA: Insufficient documentation

## 2011-11-29 DIAGNOSIS — I10 Essential (primary) hypertension: Secondary | ICD-10-CM | POA: Insufficient documentation

## 2011-11-29 DIAGNOSIS — Z7901 Long term (current) use of anticoagulants: Secondary | ICD-10-CM | POA: Insufficient documentation

## 2011-11-29 DIAGNOSIS — R002 Palpitations: Secondary | ICD-10-CM | POA: Insufficient documentation

## 2011-11-29 DIAGNOSIS — I252 Old myocardial infarction: Secondary | ICD-10-CM | POA: Insufficient documentation

## 2011-11-29 LAB — CBC WITH DIFFERENTIAL/PLATELET
Basophils Absolute: 0.1 10*3/uL (ref 0.0–0.1)
Eosinophils Relative: 4 % (ref 0–5)
Lymphocytes Relative: 16 % (ref 12–46)
Lymphs Abs: 1.3 10*3/uL (ref 0.7–4.0)
MCV: 85.8 fL (ref 78.0–100.0)
Neutro Abs: 5.8 10*3/uL (ref 1.7–7.7)
Neutrophils Relative %: 72 % (ref 43–77)
Platelets: 278 10*3/uL (ref 150–400)
RBC: 4.58 MIL/uL (ref 3.87–5.11)
RDW: 14.3 % (ref 11.5–15.5)
WBC: 8.1 10*3/uL (ref 4.0–10.5)

## 2011-11-29 LAB — BASIC METABOLIC PANEL
CO2: 27 mEq/L (ref 19–32)
Chloride: 101 mEq/L (ref 96–112)
Glucose, Bld: 109 mg/dL — ABNORMAL HIGH (ref 70–99)
Potassium: 4.3 mEq/L (ref 3.5–5.1)
Sodium: 139 mEq/L (ref 135–145)

## 2011-11-29 LAB — PROTIME-INR
INR: 2.01 — ABNORMAL HIGH (ref 0.00–1.49)
Prothrombin Time: 22 seconds — ABNORMAL HIGH (ref 11.6–15.2)

## 2011-11-29 LAB — TROPONIN I: Troponin I: <0.3 ng/mL (ref <0.30)

## 2011-11-29 NOTE — ED Notes (Signed)
C/o "heart racing" onset this afternoon. Denies CP, SOB, n/v.

## 2011-11-29 NOTE — ED Provider Notes (Signed)
History     CSN: [id number]  Arrival date & time 11/29/11  1607   First MD Initiated Contact with Patient 11/29/11 1620      Chief Complaint  Patient presents with  . Palpitations    (Consider location/radiation/quality/duration/timing/severity/associated sxs/prior treatment) Patient is a 78 y.o. female presenting with palpitations. The history is provided by the patient.  Palpitations  Pertinent negatives include no numbness, no chest pain, no abdominal pain, no nausea, no vomiting, no headaches, no back pain, no weakness and no shortness of breath.   patient states that she felt her heart racing on the right side of her chest. He went for couple hours after lunch this morning. It is back to normal now. No lightheadedness or dizziness. No chest pain. No fevers. No cough. States this does not feel like her atrial fibrillation. She's been doing well the last few days. No difficulty breathing. No leg swelling  Past Medical History  Diagnosis Date  . Atrial fibrillation   . Anxiety   . Hypertension   . Cerebrovascular accident   . Myocardial infarction   . Diverticula of colon   . Malignant neoplasm of breast     personal history of  . Anemia   . H/O: hysterectomy   . Iron deficiency   . Hyperlipidemia   . Anxiety     Past Surgical History  Procedure Date  . Cholecystectomy   . Mastectomy     left breast  . Eye surgery     bil-cataract  . Total knee arthroplasty     Family History  Problem Relation Age of Onset  . Coronary artery disease      family history of   . Heart disease      family history of     History  Substance Use Topics  . Smoking status: Never Smoker   . Smokeless tobacco: Never Used  . Alcohol Use: No    OB History    Grav Para Term Preterm Abortions TAB SAB Ect Mult Living                  Review of Systems  Constitutional: Negative for activity change and appetite change.  HENT: Negative for neck stiffness.   Eyes: Negative for  pain.  Respiratory: Negative for chest tightness and shortness of breath.   Cardiovascular: Positive for palpitations. Negative for chest pain and leg swelling.  Gastrointestinal: Negative for nausea, vomiting, abdominal pain and diarrhea.  Genitourinary: Negative for flank pain.  Musculoskeletal: Negative for back pain.  Skin: Negative for rash.  Neurological: Negative for weakness, numbness and headaches.  Psychiatric/Behavioral: Negative for behavioral problems.    Allergies  Codeine; Escitalopram oxalate; and Phenytoin sodium extended  Home Medications   Current Outpatient Rx  Name Route Sig Dispense Refill  . ALPRAZOLAM 0.5 MG PO TABS Oral Take 0.25 mg by mouth 2 (two) times daily.    Marland Kitchen AMLODIPINE BESYLATE 5 MG PO TABS Oral Take 5 mg by mouth at bedtime.     . DOFETILIDE 250 MCG PO CAPS Oral Take 250 mcg by mouth 2 (two) times daily.    Marland Kitchen LEVOTHYROXINE SODIUM 50 MCG PO TABS Oral Take 50 mcg by mouth at bedtime.     Marland Kitchen LOSARTAN POTASSIUM 50 MG PO TABS Oral Take 50 mg by mouth daily.    . ICAPS PO Oral Take 1 capsule by mouth at bedtime.    . CENTRUM PO CHEW Oral Chew 1 tablet by mouth daily.      Marland Kitchen  OLANZAPINE 5 MG PO TABS Oral Take 5 mg by mouth at bedtime.    Marland Kitchen POTASSIUM CHLORIDE CRYS ER 20 MEQ PO TBCR Oral Take 20 mEq by mouth daily.    . WARFARIN SODIUM 4 MG PO TABS Oral Take 4 mg by mouth 3 (three) times a week. Takes 4 mg on Mon, Weds, & Fri    . WARFARIN SODIUM 5 MG PO TABS Oral Take 5 mg by mouth 4 (four) times a week. Takes 5 mg Tues, Thurs, Sat, & Sun      BP 157/69  Pulse 95  Temp 98.2 F (36.8 C) (Oral)  Resp 33  SpO2 95%  Physical Exam  Nursing note and vitals reviewed. Constitutional: She is oriented to person, place, and time. She appears well-developed and well-nourished.  HENT:  Head: Normocephalic and atraumatic.  Eyes: EOM are normal. Pupils are equal, round, and reactive to light.  Neck: Normal range of motion. Neck supple.  Cardiovascular: Normal  rate, regular rhythm and normal heart sounds.   No murmur heard. Pulmonary/Chest: Effort normal and breath sounds normal. No respiratory distress. She has no wheezes. She has no rales.  Abdominal: Soft. Bowel sounds are normal. She exhibits no distension. There is no tenderness. There is no rebound and no guarding.  Musculoskeletal: Normal range of motion.  Neurological: She is alert and oriented to person, place, and time. No cranial nerve deficit.  Skin: Skin is warm and dry.  Psychiatric: She has a normal mood and affect. Her speech is normal.    ED Course  Procedures (including critical care time)  Labs Reviewed  BASIC METABOLIC PANEL - Abnormal; Notable for the following:    Glucose, Bld 109 (*)     GFR calc non Af Amer 62 (*)     GFR calc Af Amer 71 (*)     All other components within normal limits  PROTIME-INR - Abnormal; Notable for the following:    Prothrombin Time 22.0 (*)     INR 2.01 (*)     All other components within normal limits  CBC WITH DIFFERENTIAL  TROPONIN I   Dg Chest 2 View  11/29/2011  *RADIOLOGY REPORT*  Clinical Data: Palpitations  CHEST - 2 VIEW  Comparison: 10/03/2009  Findings: Lungs are essentially clear. No pleural effusion or pneumothorax.  Cardiomediastinal silhouette is within normal limits.  Mild degenerative changes of the visualized thoracolumbar spine.  Cholecystectomy clips.  IMPRESSION: No evidence of acute cardiopulmonary disease.   Original Report Authenticated By: Charline Bills, M.D.      1. Palpitations      Date: 11/29/2011  Rate: 74  Rhythm: normal sinus rhythm and sinus arrythmia  QRS Axis: normal  Intervals: normal  ST/T Wave abnormalities: normal  Conduction Disutrbances:none  Narrative Interpretation:   Old EKG Reviewed: unchanged    MDM  Patient with palpitations. History of A. fib. She states that her heart racing. Laboratories reassuring and patient feels better. She'll be discharged home. Doubt severe cardiac  cause.        Juliet Rude. Rubin Payor, MD 11/30/11 6433

## 2011-11-29 NOTE — ED Notes (Signed)
Pt taken to the restroom to give a urine sample, pt missed the specimen cup. Dr. Rubin Payor notified.

## 2011-11-29 NOTE — ED Notes (Signed)
C/o "heart racing" onset 1300 which resolved upon arrival to ED. Husband reports HR fast & irregular when he checked her pulse manually, stated pulse was 81. Reports gave an extra half a xanax at 1300 as instructed by Dr. Swaziland. Denies SOB, n/v, diaphoresis. Was seen for same in ED 10-22-11.  Pt appears extremely anxious, presently denies palpitations

## 2011-12-02 ENCOUNTER — Telehealth: Payer: Self-pay | Admitting: Cardiology

## 2011-12-02 NOTE — Telephone Encounter (Signed)
Pt's husband calling re pt going to er for anxiety attack, wants to talk to nurse
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
## 2011-12-03 ENCOUNTER — Encounter: Payer: Self-pay | Admitting: Nurse Practitioner

## 2011-12-03 ENCOUNTER — Ambulatory Visit (INDEPENDENT_AMBULATORY_CARE_PROVIDER_SITE_OTHER): Payer: Medicare Other | Admitting: Nurse Practitioner

## 2011-12-03 ENCOUNTER — Telehealth: Payer: Self-pay | Admitting: *Deleted

## 2011-12-03 VITALS — BP 138/80 | HR 58 | Ht 62.0 in | Wt 143.1 lb

## 2011-12-03 DIAGNOSIS — R002 Palpitations: Secondary | ICD-10-CM

## 2011-12-03 NOTE — Progress Notes (Signed)
[name] [name] Date of Birth: November 21, 1920 Medical Record #[id number]  History of Present Illness: [name] [name] is seen back today for a work in visit. She has had a recent ER visit. She is seen for Dr. Swaziland. She has a history of atrial fib. Has been on Tikosyn and remains on her coumadin. Other issues include anxiety, HTN, past CVA, HLD, iron deficiency anemia and prior cardiomyopathy. She sees Dr. Eden Emms Baxley for her primary care. Her last visit with Dr. Swaziland was back in January.   She has had 2 visits to the ER. It is not clear as to why she went but both evaluations were benign.She received some IV Ativan at the first visit and nothing for the second.   She comes back today. She is here with Ivar Drape, her husband. Ivar Drape says she had had some right sided discomfort. Gordon says she could feel her heart beating fast just on her right side. Her labs and CXR were ok. Negative troponin. EKG showed sinus. It was felt to be more anxiety driven. She is on chronic Xanax. She likes regular sweet tea and Ivar Drape has tried to cut her back to no avail. She currently feels ok. She is chronically anxious. Has felt tine since her last visit to the ER.   Current Outpatient Prescriptions on File Prior to Visit  Medication Sig Dispense Refill  . ALPRAZolam (XANAX) 0.5 MG tablet Take 0.25 mg by mouth 2 (two) times daily.      Marland Kitchen amLODipine (NORVASC) 5 MG tablet Take 5 mg by mouth at bedtime.       . dofetilide (TIKOSYN) 250 MCG capsule Take 250 mcg by mouth 2 (two) times daily.      Marland Kitchen levothyroxine (SYNTHROID, LEVOTHROID) 50 MCG tablet Take 50 mcg by mouth at bedtime.       Marland Kitchen losartan (COZAAR) 50 MG tablet Take 50 mg by mouth daily.      . Multiple Vitamins-Minerals (ICAPS PO) Take 1 capsule by mouth at bedtime.      . multivitamin-iron-minerals-folic acid (CENTRUM) chewable tablet Chew 1 tablet by mouth daily.        Marland Kitchen OLANZapine (ZYPREXA) 5 MG tablet Take 5 mg by mouth at bedtime.      . potassium chloride SA  (K-DUR,KLOR-CON) 20 MEQ tablet Take 20 mEq by mouth daily.      Marland Kitchen warfarin (COUMADIN) 4 MG tablet Take 4 mg by mouth 3 (three) times a week. Takes 4 mg on Mon, Weds, & Fri      . warfarin (COUMADIN) 5 MG tablet Take 5 mg by mouth 4 (four) times a week. Takes 5 mg Tues, Thurs, Sat, & Sun        Allergies  Allergen Reactions  . Codeine Other (See Comments)    unknown  . Escitalopram Oxalate Other (See Comments)    Adverse reaction  . Phenytoin Sodium Extended Other (See Comments)    unknown    Past Medical History  Diagnosis Date  . Atrial fibrillation   . Anxiety   . Hypertension   . Cerebrovascular accident   . Myocardial infarction   . Diverticula of colon   . Malignant neoplasm of breast     personal history of  . Anemia   . H/O: hysterectomy   . Iron deficiency   . Hyperlipidemia   . Anxiety     Past Surgical History  Procedure Date  . Cholecystectomy   . Mastectomy     left breast  .  Eye surgery     bil-cataract  . Total knee arthroplasty     History  Smoking status  . Never Smoker   Smokeless tobacco  . Never Used    History  Alcohol Use No    Family History  Problem Relation Age of Onset  . Coronary artery disease      family history of   . Heart disease      family history of     Review of Systems: The review of systems is per the HPI.  All other systems were reviewed and are negative.  Physical Exam: BP 138/80  Pulse 58  Ht 5\' 2"  (1.575 m)  Wt 143 lb 1.9 oz (64.919 kg)  BMI 26.18 kg/m2 Patient is very pleasant and in no acute distress. She is quite tremulous. Skin is warm and dry. Color is normal.  HEENT is unremarkable. Normocephalic/atraumatic. PERRL. Sclera are nonicteric. Neck is supple. No masses. No JVD. Lungs are clear. Cardiac exam shows a regular rate and rhythm. Abdomen is soft. Extremities are without edema. Gait and ROM are intact. No gross neurologic deficits noted.   LABORATORY DATA:  Lab Results  Component Value Date     WBC 8.1 11/29/2011   HGB 13.1 11/29/2011   HCT 39.3 11/29/2011   PLT 278 11/29/2011   GLUCOSE 109* 11/29/2011   CHOL 252* 04/04/2011   TRIG 133 04/04/2011   HDL 64 04/04/2011   LDLCALC 161* 04/04/2011   ALT 14 04/04/2011   AST 17 04/04/2011   NA 139 11/29/2011   K 4.3 11/29/2011   CL 101 11/29/2011   CREATININE 0.82 11/29/2011   BUN 20 11/29/2011   CO2 27 11/29/2011   TSH 2.624 04/04/2011   INR 2.01* 11/29/2011   Dg Chest 2 View  11/29/2011  *RADIOLOGY REPORT*  Clinical Data: Palpitations  CHEST - 2 VIEW  Comparison: 10/03/2009  Findings: Lungs are essentially clear. No pleural effusion or pneumothorax.  Cardiomediastinal silhouette is within normal limits.  Mild degenerative changes of the visualized thoracolumbar spine.  Cholecystectomy clips.  IMPRESSION: No evidence of acute cardiopulmonary disease.   Original Report Authenticated By: Charline Bills, M.D.    Lab Results  Component Value Date   CKTOTAL 65 10/04/2009   CKMB 1.9 10/04/2009   TROPONINI <0.30 11/29/2011     Assessment / Plan:  1. ? Right sided chest pain or palpitations - hard to say from her history. She seems ok however and is felt to be stable from our standpoint. No change in her current regimen. Ivar Drape will give an extra Xanax prn.   2. PAF - remains in sinus.   No change in her current regimen. I did suggest she try to cut back on her consumption of tea. We will get her back to see Dr. Swaziland in about 3 months. Her labs are up to date. Patient and her husband are agreeable to this plan and will call if any problems develop in the interim.

## 2011-12-03 NOTE — Patient Instructions (Signed)
Stay on your current medicines  I would recommend that you cut back your caffeine use  May use an extra Xanax if needed for your palpitations  See Dr. Swaziland in 3 months  Call the [facility] office at [phone] if you have any questions, problems or concerns.

## 2011-12-03 NOTE — Telephone Encounter (Signed)
[facility] [address] wants refills on Pt Xanax.

## 2011-12-04 NOTE — Telephone Encounter (Signed)
Pt husband upset that Elnita Maxwell nor Dr. Swaziland or Lawson Fiscal was not here today to authorize his wifes xanax. I notified the husband that I can take a message and send it to Dr. Illa Level nurse, he was quite upset. He hung the phone up on me. CTuck

## 2011-12-05 NOTE — Telephone Encounter (Signed)
Patient called was told xanax prescription faxed to [facility] [address].

## 2011-12-17 ENCOUNTER — Ambulatory Visit (INDEPENDENT_AMBULATORY_CARE_PROVIDER_SITE_OTHER): Payer: Medicare Other | Admitting: *Deleted

## 2011-12-17 DIAGNOSIS — I4891 Unspecified atrial fibrillation: Secondary | ICD-10-CM

## 2011-12-17 LAB — POCT INR: INR: 2.8

## 2011-12-30 ENCOUNTER — Other Ambulatory Visit: Payer: Self-pay | Admitting: *Deleted

## 2011-12-30 MED ORDER — WARFARIN SODIUM 4 MG PO TABS: 4.0000 mg | ORAL_TABLET | ORAL | Status: DC

## 2012-01-06 ENCOUNTER — Other Ambulatory Visit: Payer: Self-pay

## 2012-01-06 MED ORDER — DOFETILIDE 250 MCG PO CAPS: 250.0000 ug | ORAL_CAPSULE | Freq: Two times a day (BID) | ORAL | Status: DC

## 2012-01-07 ENCOUNTER — Other Ambulatory Visit: Payer: Self-pay | Admitting: Internal Medicine

## 2012-01-10 ENCOUNTER — Telehealth: Payer: Self-pay | Admitting: Cardiology

## 2012-01-10 MED ORDER — DOFETILIDE 250 MCG PO CAPS: 250.0000 ug | ORAL_CAPSULE | Freq: Two times a day (BID) | ORAL | Status: DC

## 2012-01-10 NOTE — Telephone Encounter (Signed)
New message:  [facility] [address]  Pt states they have trying to get since last week.

## 2012-01-28 ENCOUNTER — Ambulatory Visit (INDEPENDENT_AMBULATORY_CARE_PROVIDER_SITE_OTHER): Payer: Medicare Other | Admitting: *Deleted

## 2012-01-28 ENCOUNTER — Other Ambulatory Visit: Payer: Self-pay | Admitting: *Deleted

## 2012-01-28 DIAGNOSIS — I4891 Unspecified atrial fibrillation: Secondary | ICD-10-CM

## 2012-01-28 LAB — POCT INR: INR: 2.8

## 2012-01-28 MED ORDER — AMLODIPINE BESYLATE 5 MG PO TABS: 5.0000 mg | ORAL_TABLET | Freq: Every day | ORAL | Status: DC

## 2012-02-11 ENCOUNTER — Other Ambulatory Visit: Payer: Self-pay

## 2012-02-11 ENCOUNTER — Telehealth: Payer: Self-pay | Admitting: Cardiology

## 2012-02-11 MED ORDER — LOSARTAN POTASSIUM 50 MG PO TABS: 50.0000 mg | ORAL_TABLET | Freq: Every day | ORAL | Status: DC

## 2012-02-11 NOTE — Telephone Encounter (Signed)
plz return call to pt husband [name] [name] [phone] at hm# regarding issues with pt Losartan RX.

## 2012-03-04 ENCOUNTER — Ambulatory Visit: Payer: Medicare Other | Admitting: Cardiology

## 2012-03-09 ENCOUNTER — Ambulatory Visit (INDEPENDENT_AMBULATORY_CARE_PROVIDER_SITE_OTHER): Payer: Medicare Other | Admitting: Pharmacist

## 2012-03-09 DIAGNOSIS — I4891 Unspecified atrial fibrillation: Secondary | ICD-10-CM

## 2012-03-09 LAB — POCT INR: INR: 2.7

## 2012-04-06 ENCOUNTER — Other Ambulatory Visit: Payer: Self-pay

## 2012-04-06 MED ORDER — WARFARIN SODIUM 5 MG PO TABS: 5.0000 mg | ORAL_TABLET | ORAL | Status: DC

## 2012-04-09 ENCOUNTER — Encounter: Payer: Self-pay | Admitting: Cardiology

## 2012-04-09 ENCOUNTER — Ambulatory Visit (INDEPENDENT_AMBULATORY_CARE_PROVIDER_SITE_OTHER): Payer: Medicare Other | Admitting: Cardiology

## 2012-04-09 VITALS — BP 162/68 | HR 80 | Ht 62.0 in | Wt 144.4 lb

## 2012-04-09 DIAGNOSIS — I4891 Unspecified atrial fibrillation: Secondary | ICD-10-CM

## 2012-04-09 DIAGNOSIS — Z7901 Long term (current) use of anticoagulants: Secondary | ICD-10-CM

## 2012-04-09 DIAGNOSIS — I10 Essential (primary) hypertension: Secondary | ICD-10-CM

## 2012-04-09 DIAGNOSIS — E785 Hyperlipidemia, unspecified: Secondary | ICD-10-CM

## 2012-04-09 DIAGNOSIS — I6350 Cerebral infarction due to unspecified occlusion or stenosis of unspecified cerebral artery: Secondary | ICD-10-CM

## 2012-04-09 LAB — CBC WITH DIFFERENTIAL/PLATELET
Basophils Absolute: 0.1 10*3/uL (ref 0.0–0.1)
Eosinophils Absolute: 0.4 10*3/uL (ref 0.0–0.7)
Lymphocytes Relative: 24.3 % (ref 12.0–46.0)
MCHC: 33.5 g/dL (ref 30.0–36.0)
Monocytes Relative: 6.7 % (ref 3.0–12.0)
Neutro Abs: 5.1 10*3/uL (ref 1.4–7.7)
Platelets: 326 10*3/uL (ref 150.0–400.0)
RDW: 14.7 % — ABNORMAL HIGH (ref 11.5–14.6)

## 2012-04-09 LAB — BASIC METABOLIC PANEL
CO2: 27 mEq/L (ref 19–32)
Calcium: 9.8 mg/dL (ref 8.4–10.5)
Chloride: 102 mEq/L (ref 96–112)
Creatinine, Ser: 1 mg/dL (ref 0.4–1.2)
Sodium: 136 mEq/L (ref 135–145)

## 2012-04-09 NOTE — Patient Instructions (Signed)
We will check lab work today  Continue your current therapy  I will see you in 4 months. 

## 2012-04-09 NOTE — Progress Notes (Signed)
[name] Date of Birth: 1920-04-06 Medical Record #[id number]  History of Present Illness: [name] [name] is seen back today for a work in visit. She has had a recent ER visit. She is seen for Dr. Swaziland. She has a history of atrial fib. Has been on Tikosyn and remains on her coumadin. Other issues include anxiety, HTN, past CVA, HLD, iron deficiency anemia and prior cardiomyopathy. She denies any cardiac complaints. She denies any palpitations, shortness of breath, or dizziness. She has had no chest pain. Her major complaint is of anxiety.   Current Outpatient Prescriptions on File Prior to Visit  Medication Sig Dispense Refill  . ALPRAZolam (XANAX) 0.5 MG tablet Take 0.25 mg by mouth 2 (two) times daily.      Marland Kitchen amLODipine (NORVASC) 5 MG tablet Take 1 tablet (5 mg total) by mouth at bedtime.  30 tablet  4  . dofetilide (TIKOSYN) 250 MCG capsule Take 1 capsule (250 mcg total) by mouth 2 (two) times daily.  60 capsule  3  . levothyroxine (SYNTHROID, LEVOTHROID) 50 MCG tablet Take 50 mcg by mouth at bedtime.       Marland Kitchen losartan (COZAAR) 50 MG tablet Take 1 tablet (50 mg total) by mouth daily.  30 tablet  5  . Multiple Vitamins-Minerals (ICAPS PO) Take 1 capsule by mouth at bedtime.      . multivitamin-iron-minerals-folic acid (CENTRUM) chewable tablet Chew 1 tablet by mouth daily.        Marland Kitchen OLANZapine (ZYPREXA) 5 MG tablet TAKE 1 TABLET BY MOUTH AT BEDTIME  30 tablet  5  . potassium chloride SA (K-DUR,KLOR-CON) 20 MEQ tablet Take 20 mEq by mouth daily.      Marland Kitchen warfarin (COUMADIN) 4 MG tablet Take 1 tablet (4 mg total) by mouth as directed.  30 tablet  3  . warfarin (COUMADIN) 5 MG tablet Take 1 tablet (5 mg total) by mouth 4 (four) times a week. 90 day supply  30 tablet  3   No current facility-administered medications on file prior to visit.    Allergies  Allergen Reactions  . Codeine Other (See Comments)    unknown  . Escitalopram Oxalate Other (See Comments)    Adverse reaction  . Phenytoin  Sodium Extended Other (See Comments)    unknown    Past Medical History  Diagnosis Date  . Atrial fibrillation   . Anxiety   . Hypertension   . Cerebrovascular accident   . Myocardial infarction   . Diverticula of colon   . Malignant neoplasm of breast     personal history of  . Anemia   . H/O: hysterectomy   . Iron deficiency   . Hyperlipidemia   . Anxiety     Past Surgical History  Procedure Laterality Date  . Cholecystectomy    . Mastectomy      left breast  . Eye surgery      bil-cataract  . Total knee arthroplasty      History  Smoking status  . Never Smoker   Smokeless tobacco  . Never Used    History  Alcohol Use No    Family History  Problem Relation Age of Onset  . Coronary artery disease      family history of   . Heart disease      family history of     Review of Systems: The review of systems is per the HPI.  All other systems were reviewed and are negative.  Physical Exam:  BP 162/68  Pulse 80  Ht 5\' 2"  (1.575 m)  Wt 144 lb 6.4 oz (65.499 kg)  BMI 26.4 kg/m2 Patient is very pleasant and in no acute distress. She is quite tremulous. Skin is warm and dry. Color is normal.  HEENT is unremarkable. Normocephalic/atraumatic. PERRL. Sclera are nonicteric. Neck is supple. No masses. No JVD. Lungs are clear. Cardiac exam shows a regular rate and rhythm. Abdomen is soft. Extremities are without edema. Gait and ROM are intact. No gross neurologic deficits noted.   LABORATORY DATA:  ECG today demonstrates normal sinus rhythm with a normal ECG. QTC is normal at 422 ms.  Assessment / Plan:  1. PAF - remains in sinus. Continue Tikosyn. We will check a basic metabolic panel and CBC today. Continue Coumadin for anticoagulation.  2. Hypertension, controlled.  3. History of CVA.

## 2012-04-15 ENCOUNTER — Other Ambulatory Visit: Payer: Self-pay

## 2012-04-15 MED ORDER — POTASSIUM CHLORIDE CRYS ER 20 MEQ PO TBCR: 20.0000 meq | EXTENDED_RELEASE_TABLET | Freq: Every day | ORAL | Status: DC

## 2012-04-20 ENCOUNTER — Ambulatory Visit (INDEPENDENT_AMBULATORY_CARE_PROVIDER_SITE_OTHER): Payer: Medicare Other | Admitting: *Deleted

## 2012-04-20 DIAGNOSIS — I4891 Unspecified atrial fibrillation: Secondary | ICD-10-CM

## 2012-04-20 LAB — POCT INR: INR: 3.2

## 2012-04-25 ENCOUNTER — Other Ambulatory Visit: Payer: Self-pay | Admitting: Internal Medicine

## 2012-05-09 ENCOUNTER — Other Ambulatory Visit: Payer: Self-pay | Admitting: Cardiology

## 2012-05-18 ENCOUNTER — Ambulatory Visit (INDEPENDENT_AMBULATORY_CARE_PROVIDER_SITE_OTHER): Payer: Medicare Other | Admitting: Pharmacist

## 2012-05-18 DIAGNOSIS — I4891 Unspecified atrial fibrillation: Secondary | ICD-10-CM

## 2012-05-18 LAB — POCT INR: INR: 3

## 2012-06-22 ENCOUNTER — Ambulatory Visit (INDEPENDENT_AMBULATORY_CARE_PROVIDER_SITE_OTHER): Payer: Medicare Other

## 2012-06-22 DIAGNOSIS — I4891 Unspecified atrial fibrillation: Secondary | ICD-10-CM

## 2012-06-25 ENCOUNTER — Other Ambulatory Visit: Payer: Self-pay

## 2012-06-25 MED ORDER — AMLODIPINE BESYLATE 5 MG PO TABS: 5.0000 mg | ORAL_TABLET | Freq: Every day | ORAL | Status: DC

## 2012-07-04 ENCOUNTER — Other Ambulatory Visit: Payer: Self-pay | Admitting: Internal Medicine

## 2012-07-06 NOTE — Telephone Encounter (Signed)
May need appt. Please check to see and schedule 6 month recheck appt.

## 2012-07-07 ENCOUNTER — Other Ambulatory Visit: Payer: Self-pay

## 2012-07-07 MED ORDER — OLANZAPINE 5 MG PO TABS: 5.0000 mg | ORAL_TABLET | Freq: Every day | ORAL | Status: DC

## 2012-07-07 NOTE — Telephone Encounter (Signed)
Spoke with patient today about an appointment needed with Dr. Lenord Fellers. She stated her and her husband both have many appointments in the month of June and couldn't possibly come. Advised her that if Dr. Lenord Fellers  Is to continue Rx medication for her,  She must make an appointment.

## 2012-07-16 ENCOUNTER — Ambulatory Visit: Payer: Medicare Other | Admitting: Internal Medicine

## 2012-07-17 ENCOUNTER — Ambulatory Visit: Payer: Medicare Other | Admitting: Internal Medicine

## 2012-07-20 ENCOUNTER — Other Ambulatory Visit: Payer: Self-pay

## 2012-07-21 ENCOUNTER — Ambulatory Visit (INDEPENDENT_AMBULATORY_CARE_PROVIDER_SITE_OTHER): Payer: Medicare Other | Admitting: Internal Medicine

## 2012-07-21 ENCOUNTER — Encounter: Payer: Self-pay | Admitting: Internal Medicine

## 2012-07-21 VITALS — BP 132/76 | HR 84 | Temp 98.6°F | Wt 141.0 lb

## 2012-07-21 DIAGNOSIS — E039 Hypothyroidism, unspecified: Secondary | ICD-10-CM

## 2012-07-21 DIAGNOSIS — Z7901 Long term (current) use of anticoagulants: Secondary | ICD-10-CM

## 2012-07-21 DIAGNOSIS — F411 Generalized anxiety disorder: Secondary | ICD-10-CM

## 2012-07-21 DIAGNOSIS — E785 Hyperlipidemia, unspecified: Secondary | ICD-10-CM

## 2012-07-21 DIAGNOSIS — I10 Essential (primary) hypertension: Secondary | ICD-10-CM

## 2012-07-21 DIAGNOSIS — Z8679 Personal history of other diseases of the circulatory system: Secondary | ICD-10-CM

## 2012-07-21 DIAGNOSIS — J309 Allergic rhinitis, unspecified: Secondary | ICD-10-CM

## 2012-07-24 MED ORDER — ALPRAZOLAM 0.5 MG PO TABS: 0.2500 mg | ORAL_TABLET | Freq: Two times a day (BID) | ORAL | Status: DC

## 2012-07-28 ENCOUNTER — Other Ambulatory Visit: Payer: Medicare Other | Admitting: Internal Medicine

## 2012-07-28 DIAGNOSIS — F411 Generalized anxiety disorder: Secondary | ICD-10-CM

## 2012-07-28 DIAGNOSIS — I4891 Unspecified atrial fibrillation: Secondary | ICD-10-CM

## 2012-07-28 DIAGNOSIS — Z130 Encounter for screening for diseases of the blood and blood-forming organs and certain disorders involving the immune mechanism: Secondary | ICD-10-CM

## 2012-07-28 DIAGNOSIS — Z79899 Other long term (current) drug therapy: Secondary | ICD-10-CM

## 2012-07-28 DIAGNOSIS — I10 Essential (primary) hypertension: Secondary | ICD-10-CM

## 2012-07-28 DIAGNOSIS — E039 Hypothyroidism, unspecified: Secondary | ICD-10-CM

## 2012-07-28 DIAGNOSIS — G250 Essential tremor: Secondary | ICD-10-CM

## 2012-07-28 LAB — COMPREHENSIVE METABOLIC PANEL
ALT: 13 U/L (ref 0–35)
AST: 16 U/L (ref 0–37)
Alkaline Phosphatase: 82 U/L (ref 39–117)
Creat: 0.98 mg/dL (ref 0.50–1.10)
Sodium: 139 mEq/L (ref 135–145)
Total Bilirubin: 0.5 mg/dL (ref 0.3–1.2)
Total Protein: 7.1 g/dL (ref 6.0–8.3)

## 2012-07-28 LAB — TSH: TSH: 3.269 u[IU]/mL (ref 0.350–4.500)

## 2012-07-28 LAB — CBC WITH DIFFERENTIAL/PLATELET
Basophils Absolute: 0 10*3/uL (ref 0.0–0.1)
Basophils Relative: 1 % (ref 0–1)
Eosinophils Absolute: 0.5 10*3/uL (ref 0.0–0.7)
Eosinophils Relative: 6 % — ABNORMAL HIGH (ref 0–5)
Lymphs Abs: 1.8 10*3/uL (ref 0.7–4.0)
MCH: 28.3 pg (ref 26.0–34.0)
MCV: 85.8 fL (ref 78.0–100.0)
Neutrophils Relative %: 63 % (ref 43–77)
Platelets: 365 10*3/uL (ref 150–400)
RBC: 4.59 MIL/uL (ref 3.87–5.11)
RDW: 14.9 % (ref 11.5–15.5)

## 2012-07-28 LAB — LIPID PANEL
LDL Cholesterol: 144 mg/dL — ABNORMAL HIGH (ref 0–99)
Total CHOL/HDL Ratio: 3.4 Ratio
VLDL: 22 mg/dL (ref 0–40)

## 2012-07-28 LAB — HEPATIC FUNCTION PANEL
Bilirubin, Direct: 0.1 mg/dL (ref 0.0–0.3)
Total Bilirubin: 0.5 mg/dL (ref 0.3–1.2)

## 2012-07-29 ENCOUNTER — Ambulatory Visit (INDEPENDENT_AMBULATORY_CARE_PROVIDER_SITE_OTHER): Payer: Medicare Other | Admitting: Cardiology

## 2012-07-29 ENCOUNTER — Ambulatory Visit (INDEPENDENT_AMBULATORY_CARE_PROVIDER_SITE_OTHER): Payer: Medicare Other | Admitting: Pharmacist

## 2012-07-29 ENCOUNTER — Encounter: Payer: Self-pay | Admitting: Cardiology

## 2012-07-29 VITALS — BP 134/60 | HR 87 | Ht 62.0 in | Wt 142.0 lb

## 2012-07-29 DIAGNOSIS — I4891 Unspecified atrial fibrillation: Secondary | ICD-10-CM

## 2012-07-29 DIAGNOSIS — I10 Essential (primary) hypertension: Secondary | ICD-10-CM

## 2012-07-29 DIAGNOSIS — E785 Hyperlipidemia, unspecified: Secondary | ICD-10-CM

## 2012-07-29 DIAGNOSIS — Z7901 Long term (current) use of anticoagulants: Secondary | ICD-10-CM

## 2012-07-29 LAB — POCT INR: INR: 2.6

## 2012-07-29 NOTE — Patient Instructions (Signed)
Continue your current therapy  I will see you in 6 months.   

## 2012-08-01 NOTE — Progress Notes (Signed)
[name] [name] Date of Birth: 12-Sep-1920 Medical Record #[id number]  History of Present Illness: [name] [name] is seen back today for a followup visit. She has a history of atrial fib. Has been on Tikosyn and remains on her coumadin. Other issues include anxiety, HTN, past CVA, HLD, iron deficiency anemia and prior cardiomyopathy. She denies any cardiac complaints. She denies any palpitations, shortness of breath, or dizziness. She has had no chest pain. Her major complaint is of anxiety. Her Xanax dose was recently increased by Dr. Lenord Fellers.   Current Outpatient Prescriptions on File Prior to Visit  Medication Sig Dispense Refill  . ALPRAZolam (XANAX) 0.5 MG tablet Take 0.5 tablets (0.25 mg total) by mouth 2 (two) times daily.  60 tablet  2  . amLODipine (NORVASC) 5 MG tablet Take 1 tablet (5 mg total) by mouth at bedtime.  30 tablet  6  . KLOR-CON M20 20 MEQ tablet TAKE 1 TABLET (20 MEQ TOTAL) BY MOUTH DAILY.  30 tablet  3  . losartan (COZAAR) 50 MG tablet Take 1 tablet (50 mg total) by mouth daily.  30 tablet  5  . Multiple Vitamins-Minerals (ICAPS PO) Take 1 capsule by mouth at bedtime.      . multivitamin-iron-minerals-folic acid (CENTRUM) chewable tablet Chew 1 tablet by mouth daily.        Marland Kitchen OLANZapine (ZYPREXA) 5 MG tablet Take 1 tablet (5 mg total) by mouth at bedtime.  30 tablet  0  . SYNTHROID 50 MCG tablet TAKE 1 TABLET BY MOUTH EVERY DAY  30 tablet  5  . TIKOSYN 250 MCG capsule TAKE 1 CAPSULE BY MOUTH 2 TIMES DAILY.  60 capsule  3  . warfarin (COUMADIN) 4 MG tablet Take 1 tablet (4 mg total) by mouth as directed.  30 tablet  3  . warfarin (COUMADIN) 5 MG tablet Take 1 tablet (5 mg total) by mouth 4 (four) times a week. 90 day supply  30 tablet  3   No current facility-administered medications on file prior to visit.    Allergies  Allergen Reactions  . Codeine Other (See Comments)    unknown  . Escitalopram Oxalate Other (See Comments)    Adverse reaction  . Phenytoin Sodium  Extended Other (See Comments)    unknown    Past Medical History  Diagnosis Date  . Atrial fibrillation   . Anxiety   . Hypertension   . Cerebrovascular accident   . Myocardial infarction   . Diverticula of colon   . Malignant neoplasm of breast     personal history of  . Anemia   . H/O: hysterectomy   . Iron deficiency   . Hyperlipidemia   . Anxiety     Past Surgical History  Procedure Laterality Date  . Cholecystectomy    . Mastectomy      left breast  . Eye surgery      bil-cataract  . Total knee arthroplasty      History  Smoking status  . Never Smoker   Smokeless tobacco  . Never Used    History  Alcohol Use No    Family History  Problem Relation Age of Onset  . Coronary artery disease      family history of   . Heart disease      family history of     Review of Systems: The review of systems is per the HPI.  All other systems were reviewed and are negative.  Physical Exam: BP 134/60  Pulse 87  Ht 5\' 2"  (1.575 m)  Wt 142 lb (64.411 kg)  BMI 25.97 kg/m2  SpO2 96% Patient is very pleasant and in no acute distress. She is quite tremulous. Skin is warm and dry. Color is normal.  HEENT is unremarkable. Normocephalic/atraumatic. PERRL. Sclera are nonicteric. Neck is supple. No masses. No JVD. Lungs are clear. Cardiac exam shows a regular rate and rhythm. Abdomen is soft. Extremities are without edema. Gait and ROM are intact. No gross neurologic deficits noted.   LABORATORY DATA:  ECG today demonstrates normal sinus rhythm with a normal ECG. QTC is normal at 433 ms  INR is 2.6.  Assessment / Plan:  1. PAF - remains in sinus. Continue Tikosyn.  Continue Coumadin for anticoagulation.  2. Hypertension, controlled.  3. History of CVA.

## 2012-08-07 ENCOUNTER — Ambulatory Visit: Payer: Medicare Other | Admitting: Cardiology

## 2012-08-29 ENCOUNTER — Other Ambulatory Visit: Payer: Self-pay | Admitting: Internal Medicine

## 2012-08-29 ENCOUNTER — Other Ambulatory Visit: Payer: Self-pay | Admitting: Cardiology

## 2012-08-31 NOTE — Telephone Encounter (Signed)
TIKOSYN 250 MCG capsule  TAKE 1 CAPSULE BY MOUTH 2 TIMES DAILY.  60 capsule  1. PAF - remains in sinus. Continue Tikosyn. Continue Coumadin for anticoagulation. Peter M Swaziland, MD at 08/01/2012  4:20 PM

## 2012-09-09 ENCOUNTER — Ambulatory Visit (INDEPENDENT_AMBULATORY_CARE_PROVIDER_SITE_OTHER): Payer: Medicare Other | Admitting: *Deleted

## 2012-09-09 DIAGNOSIS — I4891 Unspecified atrial fibrillation: Secondary | ICD-10-CM

## 2012-09-09 LAB — POCT INR: INR: 2.5

## 2012-09-14 ENCOUNTER — Other Ambulatory Visit: Payer: Self-pay | Admitting: Cardiology

## 2012-09-14 ENCOUNTER — Other Ambulatory Visit: Payer: Self-pay | Admitting: *Deleted

## 2012-09-14 MED ORDER — WARFARIN SODIUM 4 MG PO TABS: ORAL_TABLET | ORAL | Status: DC

## 2012-09-29 ENCOUNTER — Encounter: Payer: Self-pay | Admitting: Internal Medicine

## 2012-10-21 ENCOUNTER — Ambulatory Visit (INDEPENDENT_AMBULATORY_CARE_PROVIDER_SITE_OTHER): Payer: Medicare Other | Admitting: *Deleted

## 2012-10-21 DIAGNOSIS — I4891 Unspecified atrial fibrillation: Secondary | ICD-10-CM

## 2012-10-24 ENCOUNTER — Other Ambulatory Visit: Payer: Self-pay | Admitting: Internal Medicine

## 2012-11-02 ENCOUNTER — Telehealth: Payer: Self-pay | Admitting: *Deleted

## 2012-11-02 MED ORDER — WARFARIN SODIUM 5 MG PO TABS: 5.0000 mg | ORAL_TABLET | ORAL | Status: DC

## 2012-11-02 NOTE — Telephone Encounter (Signed)
Refill request

## 2012-12-02 ENCOUNTER — Ambulatory Visit (INDEPENDENT_AMBULATORY_CARE_PROVIDER_SITE_OTHER): Payer: Medicare Other | Admitting: *Deleted

## 2012-12-02 DIAGNOSIS — I4891 Unspecified atrial fibrillation: Secondary | ICD-10-CM

## 2012-12-02 LAB — POCT INR: INR: 2.9

## 2013-01-03 ENCOUNTER — Other Ambulatory Visit: Payer: Self-pay | Admitting: Cardiology

## 2013-01-10 NOTE — Patient Instructions (Signed)
Increase Xanax at 4 PM to one whole tablet. Return in 6 months for physical exam.

## 2013-01-10 NOTE — Progress Notes (Signed)
   Subjective:    Patient ID: [name] [name], female    DOB: 25-Apr-1920, 79 y.o.   MRN: [id number]  HPI 79 year old female for followup visit. Not seen since February 2013. Has been followed here since 1993. Is followed closely by Dr. Peter Swaziland, cardiologist. She developed hypertension around 1983. In 1996, she developed cardiomyopathy with congestive heart failure and was found to have a left lacunar infarction. Cardiac catheterization at that time was negative. She is on chronic Coumadin therapy for atrial fibrillation.  For several years , she really hasn't wanted to leave her house saying that she didn't have anything to wear. Seems to be abscessed with her appearance. Has been unwilling to see psychiatrist. Long-standing history of anxiety. Likely has dementia. She will go to the beauty shop. She and her husband continue to live independently.  History of right breast cancer diagnosed 77. Had left mastectomy January 2001. Has had right knee replacement by Dr. Jennye Moccasin oh July 2005. Bilateral cataract extractions in 2006, tonsillectomy in the late 1930s. Hysterectomy with partial oophorectomy 1956, left breast biopsy which was benign in 1985, cholecystectomy 1989, history of lumbar nerve root impingement L4-L5 November 2005, history of iron deficiency anemia 2008. History of PVCs. History of macular degeneration. History of allergic rhinitis. History of hypothyroidism and vitamin D deficiency. Has been evaluated for mild hypercalcemia with normal intact parathyroid hormone assay in 2011 when calcium was 11.5. Subsequent serum calcium February 2012 was 9.9. Was evaluated for dementia in 2010 with normal B12 and folate levels. MRI of the brain with and without contrast done and was unremarkable except for atrophy and small vessel disease.  No known drug allergies but may have had an adverse reaction to Lexapro. Apparently developed a rash on Dilantin. History of intolerance to amiodarone and  Flecainide.  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 Husband is retired from [employer]. 2 adult children. Does not smoke or consume alcohol.  Family history: Father died at age 90 as a result of a diving accident in which his neck was fractured. Mother died at age 56 with congestive heart failure. 2 brothers one of them had prostate cancer. No sisters.  Accompanied by daughter-in-law today.    Review of Systems     Objective:   Physical Exam Skin is warm and dry. Nodes none. Neck is supple without JVD or thyromegaly. Chest clear to auscultation. Cardiac exam: Regular rate and  rhythm. Extremities without edema. Affect is anxious. Repeatedly states she has nothing to wear and doesn't want to go anywhere. However she is alert and oriented x3.  25 minutes spent with patient       Assessment & Plan:  History of atrial fibrillation on chronic Coumadin therapy  Anxiety-may take one whole Xanax tablet at 4 PM  Possible dementia but she is alert and oriented x3 today. Previous workup for dementia was unremarkable except for small vessel disease.  Hypothyroidism-TSH stable on current dose of Synthroid.  History of vitamin D deficiency-low normal level.  History of macular degeneration  History of hyperlipidemia-currently not taking statin medication. Total cholesterol and LDL cholesterol elevated. Family had requested discontinuing this medication.  History of allergic rhinitis  History of breast cancer  Plan: Return in 6 months for physical examination. Continue to renew current medications including Zyprexa.

## 2013-01-13 ENCOUNTER — Ambulatory Visit (INDEPENDENT_AMBULATORY_CARE_PROVIDER_SITE_OTHER): Payer: Medicare Other | Admitting: *Deleted

## 2013-01-13 DIAGNOSIS — I4891 Unspecified atrial fibrillation: Secondary | ICD-10-CM

## 2013-01-13 LAB — POCT INR: INR: 3.1

## 2013-01-20 ENCOUNTER — Ambulatory Visit (INDEPENDENT_AMBULATORY_CARE_PROVIDER_SITE_OTHER): Payer: Medicare Other | Admitting: Physician Assistant

## 2013-01-20 ENCOUNTER — Encounter: Payer: Self-pay | Admitting: Physician Assistant

## 2013-01-20 VITALS — BP 120/80 | HR 81 | Ht 62.0 in | Wt 142.0 lb

## 2013-01-20 DIAGNOSIS — I10 Essential (primary) hypertension: Secondary | ICD-10-CM

## 2013-01-20 DIAGNOSIS — F411 Generalized anxiety disorder: Secondary | ICD-10-CM

## 2013-01-20 DIAGNOSIS — I4891 Unspecified atrial fibrillation: Secondary | ICD-10-CM

## 2013-01-20 DIAGNOSIS — F419 Anxiety disorder, unspecified: Secondary | ICD-10-CM

## 2013-01-20 MED ORDER — AMLODIPINE BESYLATE 5 MG PO TABS: 5.0000 mg | ORAL_TABLET | Freq: Every day | ORAL | Status: DC

## 2013-01-20 NOTE — Progress Notes (Signed)
HPI:   This is a 79 year old female patient of Dr. Swaziland who is here with her husband of 72 years. She has history of paroxysmal atrial fibrillation treated with decrease in and Coumadin. She also has hypertension, anxiety and history of CVAs. Patient has done well without chest pain, palpitations, dyspnea, dyspnea on exertion, dizziness, or presyncope. She is very nervous and doesn't want an EKG today.   Allergies-- Codeine -- Other (See Comments)   --  unknown  -- Escitalopram Oxalate -- Other (See Comments)   --  Adverse reaction  -- Phenytoin Sodium Extended -- Other (See Comments)   --  unknown  Current Outpatient Prescriptions on File Prior to Visit: ALPRAZolam (XANAX) 0.5 MG tablet, Take 0.5 tablets (0.25 mg total) by mouth 2 (two) times daily., Disp: 60 tablet, Rfl: 2 KLOR-CON M20 20 MEQ tablet, TAKE 1 TABLET (20 MEQ TOTAL) BY MOUTH DAILY., Disp: 30 tablet, Rfl: 3 losartan (COZAAR) 50 MG tablet, Take 1 tablet (50 mg total) by mouth daily., Disp: 30 tablet, Rfl: 5 Multiple Vitamins-Minerals (ICAPS PO), Take 1 capsule by mouth at bedtime., Disp: , Rfl:  multivitamin-iron-minerals-folic acid (CENTRUM) chewable tablet, Chew 1 tablet by mouth daily.  , Disp: , Rfl:  OLANZapine (ZYPREXA) 5 MG tablet, TAKE 1 TABLET (5 MG TOTAL) BY MOUTH AT BEDTIME., Disp: 30 tablet, Rfl: 5 SYNTHROID 50 MCG tablet, TAKE 1 TABLET BY MOUTH EVERY DAY, Disp: 30 tablet, Rfl: 11 TIKOSYN 250 MCG capsule, TAKE 1 CAPSULE BY MOUTH 2 TIMES DAILY., Disp: 60 capsule, Rfl: 11 warfarin (COUMADIN) 4 MG tablet, Take as  Directed by coumadin clinic, Disp: 40 tablet, Rfl: 1 warfarin (COUMADIN) 5 MG tablet, Take 1 tablet (5 mg total) by mouth 4 (four) times a week., Disp: 30 tablet, Rfl: 3  No current facility-administered medications on file prior to visit.   Past Medical History:   Atrial fibrillation                                          Anxiety                                                      Hypertension                                                  Cerebrovascular accident                                     Myocardial infarction                                        Diverticula of colon                                         Malignant neoplasm of breast  Comment:personal history of   Anemia                                                       H/O: hysterectomy                                            Iron deficiency                                              Hyperlipidemia                                               Anxiety                                                     Past Surgical History:   CHOLECYSTECTOMY                                               MASTECTOMY                                                      Comment:left breast   EYE SURGERY                                                     Comment:bil-cataract   TOTAL KNEE ARTHROPLASTY                                      Review of patient's family history indicates:   Coronary artery disease                                   Comment: family history of    Heart disease                                             Comment: family history of    Social History   Marital Status: Married             Spouse Name:  Years of Education:                 Number of children: 2           Occupational History Occupation          Psychiatric nurse                Social History Main Topics   Smoking Status: Never Smoker                     Smokeless Status: Never Used                       Alcohol Use: No             Drug Use: No             Sexual Activity: Not on file        Other Topics            Concern   None on file  Social History Narrative   None on file    ROS: See history of present illness otherwise negative   PHYSICAL EXAM: Well-nournished, in no acute distress. Neck: No JVD, HJR, Bruit, or thyroid enlargement  Lungs: No  tachypnea, clear without wheezing, rales, or rhonchi  Cardiovascular: RRR, PMI not displaced, 1-2/6 systolic murmur at the left sternal border, no gallops, bruit, thrill, or heave.  Abdomen: BS normal. Soft without organomegaly, masses, lesions or tenderness.  Extremities: without cyanosis, clubbing or edema. Good distal pulses bilateral  SKin: Warm, no lesions or rashes   Musculoskeletal: No deformities  Neuro: no focal signs  BP 120/80  Pulse 81  Ht 5\' 2"  (1.575 m)  Wt 142 lb (64.411 kg)  BMI 25.97 kg/m2   EKG: Refused

## 2013-01-20 NOTE — Assessment & Plan Note (Signed)
Blood pressure stable ? ?

## 2013-01-20 NOTE — Assessment & Plan Note (Signed)
Seems to be her main issue

## 2013-01-20 NOTE — Assessment & Plan Note (Signed)
Patient sounds to be in normal sinus rhythm. She did not want an EKG today. She has been doing well.

## 2013-01-20 NOTE — Patient Instructions (Signed)
Your physician wants you to follow-up in: 6 months with Dr. Swaziland You will receive a reminder letter in the mail two months in advance. If you don't receive a letter, please call our office to schedule the follow-up appointment.   Your physician recommends that you continue on your current medications as directed. Please refer to the Current Medication list given to you today.

## 2013-01-21 ENCOUNTER — Telehealth: Payer: Self-pay

## 2013-01-21 MED ORDER — ALPRAZOLAM 0.5 MG PO TABS: 0.2500 mg | ORAL_TABLET | Freq: Two times a day (BID) | ORAL | Status: DC

## 2013-01-21 MED ORDER — ALPRAZOLAM 0.25 MG PO TABS: ORAL_TABLET | ORAL | Status: DC

## 2013-01-21 NOTE — Telephone Encounter (Signed)
Spoke to patient's husband xanax 0.25 mg refill called into pharmacy.

## 2013-01-28 ENCOUNTER — Ambulatory Visit: Payer: Medicare Other | Admitting: Cardiology

## 2013-01-28 ENCOUNTER — Ambulatory Visit: Payer: Medicare Other | Admitting: Physician Assistant

## 2013-02-19 ENCOUNTER — Other Ambulatory Visit: Payer: Self-pay | Admitting: Internal Medicine

## 2013-02-24 ENCOUNTER — Ambulatory Visit (INDEPENDENT_AMBULATORY_CARE_PROVIDER_SITE_OTHER): Payer: Medicare Other | Admitting: *Deleted

## 2013-02-24 ENCOUNTER — Other Ambulatory Visit: Payer: Self-pay | Admitting: *Deleted

## 2013-02-24 DIAGNOSIS — I4891 Unspecified atrial fibrillation: Secondary | ICD-10-CM

## 2013-02-24 LAB — POCT INR: INR: 2.8

## 2013-02-24 MED ORDER — WARFARIN SODIUM 5 MG PO TABS: ORAL_TABLET | ORAL | Status: DC

## 2013-02-24 NOTE — Telephone Encounter (Signed)
Refill done as requested 

## 2013-04-03 ENCOUNTER — Other Ambulatory Visit: Payer: Self-pay | Admitting: Cardiology

## 2013-04-07 ENCOUNTER — Ambulatory Visit (INDEPENDENT_AMBULATORY_CARE_PROVIDER_SITE_OTHER): Payer: Medicare Other | Admitting: *Deleted

## 2013-04-07 DIAGNOSIS — I4891 Unspecified atrial fibrillation: Secondary | ICD-10-CM

## 2013-04-07 LAB — POCT INR: INR: 2.5

## 2013-04-10 ENCOUNTER — Other Ambulatory Visit: Payer: Self-pay | Admitting: Cardiology

## 2013-04-12 MED ORDER — ALPRAZOLAM 0.25 MG PO TABS: 0.2500 mg | ORAL_TABLET | Freq: Two times a day (BID) | ORAL | Status: DC | PRN

## 2013-04-13 ENCOUNTER — Other Ambulatory Visit: Payer: Self-pay | Admitting: Cardiology

## 2013-04-28 ENCOUNTER — Other Ambulatory Visit: Payer: Self-pay

## 2013-04-28 MED ORDER — LOSARTAN POTASSIUM 50 MG PO TABS: 50.0000 mg | ORAL_TABLET | Freq: Every day | ORAL | Status: DC

## 2013-05-15 ENCOUNTER — Other Ambulatory Visit: Payer: Self-pay | Admitting: Cardiology

## 2013-05-19 ENCOUNTER — Ambulatory Visit (INDEPENDENT_AMBULATORY_CARE_PROVIDER_SITE_OTHER): Payer: 59 | Admitting: Pharmacist

## 2013-05-19 DIAGNOSIS — I4891 Unspecified atrial fibrillation: Secondary | ICD-10-CM

## 2013-05-19 LAB — POCT INR: INR: 2.6

## 2013-06-22 ENCOUNTER — Telehealth: Payer: Self-pay | Admitting: Internal Medicine

## 2013-06-22 NOTE — Telephone Encounter (Signed)
Not clear to me why this is needed by an [employer]

## 2013-06-22 NOTE — Telephone Encounter (Signed)
I spoke in detail with the son, Louie Casa regarding the Power of [place], and what exactly [facility] needs that information for.  He states that if he or his wife call to inquire on a charge or invoice of any kind, they're told that because of PHI/HIPPA, they cannot speak to them.  They then advised [employer] that they are Mr. & Mrs. [place].  They faxed that to [facility].  [employer] then required a letter from Dr. Renold Genta.  Dr. Renold Genta advised that she has never provided a letter to an insurance company r/t Medical laboratory scientific officer.  Patient's family should only have this in their hands to take to the hospital with them should it be necessary.    I provided this information to the son, Louie Casa.  Suggested that they contact Gaffney from his Mom's residence and have HER speak to the [facility] rep and let them document that it is ok to speak with "whomever" on her behalf.  Dr. Renold Genta will not do the letter regarding POA because we don't even have a copy of the POA (unsure if it is full POA or healthcare POA, etc.).  Copy of [place] letter placed in patient's paper chart.

## 2013-06-30 ENCOUNTER — Ambulatory Visit (INDEPENDENT_AMBULATORY_CARE_PROVIDER_SITE_OTHER): Payer: 59 | Admitting: Surgery

## 2013-06-30 DIAGNOSIS — I4891 Unspecified atrial fibrillation: Secondary | ICD-10-CM

## 2013-06-30 LAB — POCT INR: INR: 2.4

## 2013-07-02 ENCOUNTER — Other Ambulatory Visit: Payer: Self-pay | Admitting: Cardiology

## 2013-07-10 ENCOUNTER — Other Ambulatory Visit: Payer: Self-pay | Admitting: Cardiology

## 2013-07-24 ENCOUNTER — Other Ambulatory Visit: Payer: Self-pay | Admitting: Cardiology

## 2013-07-26 ENCOUNTER — Other Ambulatory Visit: Payer: Self-pay | Admitting: *Deleted

## 2013-08-01 ENCOUNTER — Other Ambulatory Visit: Payer: Self-pay | Admitting: Cardiology

## 2013-08-11 ENCOUNTER — Ambulatory Visit (INDEPENDENT_AMBULATORY_CARE_PROVIDER_SITE_OTHER): Payer: Medicare Other | Admitting: *Deleted

## 2013-08-11 DIAGNOSIS — I4891 Unspecified atrial fibrillation: Secondary | ICD-10-CM

## 2013-08-11 LAB — POCT INR: INR: 2.7

## 2013-08-17 ENCOUNTER — Other Ambulatory Visit: Payer: Self-pay | Admitting: Internal Medicine

## 2013-08-17 ENCOUNTER — Other Ambulatory Visit: Payer: Self-pay | Admitting: Cardiology

## 2013-08-24 ENCOUNTER — Other Ambulatory Visit: Payer: Self-pay

## 2013-08-24 MED ORDER — AMLODIPINE BESYLATE 5 MG PO TABS: 5.0000 mg | ORAL_TABLET | Freq: Every day | ORAL | Status: DC

## 2013-08-28 ENCOUNTER — Other Ambulatory Visit: Payer: Self-pay | Admitting: Cardiology

## 2013-09-22 ENCOUNTER — Ambulatory Visit (INDEPENDENT_AMBULATORY_CARE_PROVIDER_SITE_OTHER): Payer: Medicare Other | Admitting: Pharmacist

## 2013-09-22 DIAGNOSIS — I4891 Unspecified atrial fibrillation: Secondary | ICD-10-CM

## 2013-09-22 LAB — POCT INR: INR: 2.2

## 2013-10-05 ENCOUNTER — Other Ambulatory Visit: Payer: Self-pay | Admitting: Internal Medicine

## 2013-10-05 NOTE — Telephone Encounter (Signed)
TSH has not been checked since June 2014. Patient needs to be seen and have lab work done as well as a  physical exam. If The family is not willing to do this then we cannot continue to refill medication. Cardiology is doing anticoagulation on a regular basis but has not checked her TSH and we are being asked to refill her thyroid replacement.

## 2013-10-09 ENCOUNTER — Other Ambulatory Visit: Payer: Self-pay | Admitting: Cardiology

## 2013-10-12 ENCOUNTER — Ambulatory Visit (INDEPENDENT_AMBULATORY_CARE_PROVIDER_SITE_OTHER): Payer: Medicare Other | Admitting: Cardiology

## 2013-10-12 ENCOUNTER — Encounter: Payer: Self-pay | Admitting: Cardiology

## 2013-10-12 VITALS — BP 146/68 | HR 62 | Ht 62.0 in | Wt 134.5 lb

## 2013-10-12 DIAGNOSIS — Z7901 Long term (current) use of anticoagulants: Secondary | ICD-10-CM

## 2013-10-12 DIAGNOSIS — I4891 Unspecified atrial fibrillation: Secondary | ICD-10-CM

## 2013-10-12 DIAGNOSIS — I10 Essential (primary) hypertension: Secondary | ICD-10-CM

## 2013-10-12 DIAGNOSIS — I480 Paroxysmal atrial fibrillation: Secondary | ICD-10-CM

## 2013-10-12 MED ORDER — ALPRAZOLAM 0.25 MG PO TABS: 0.2500 mg | ORAL_TABLET | Freq: Two times a day (BID) | ORAL | Status: DC | PRN

## 2013-10-12 NOTE — Patient Instructions (Signed)
Continue your current therapy  I will see you in 6 months.   

## 2013-10-12 NOTE — Progress Notes (Signed)
[name] [name] Date of Birth: Mar 19, 1920 Medical Record #[id number]  History of Present Illness: [name] [name] is seen back today for a followup visit. She has a history of atrial fib. Has been on Tikosyn and remains on her coumadin. She is intolerant of amiodarone. Other issues include anxiety, HTN, past CVA, HLD, iron deficiency anemia and prior cardiomyopathy. She denies any cardiac complaints. She denies any palpitations, shortness of breath, or dizziness. She has had no chest pain. Her lab work is followed by Dr. Renold Genta and she is scheduled for a physical in the near future.   Current Outpatient Prescriptions on File Prior to Visit  Medication Sig Dispense Refill  . amLODipine (NORVASC) 5 MG tablet Take 1 tablet (5 mg total) by mouth at bedtime.  30 tablet  6  . KLOR-CON M20 20 MEQ tablet TAKE 1 TABLET BY MOUTH EVERY DAY  30 tablet  0  . losartan (COZAAR) 50 MG tablet TAKE 1 TABLET (50 MG TOTAL) BY MOUTH DAILY.  90 tablet  0  . Multiple Vitamins-Minerals (ICAPS PO) Take 1 capsule by mouth at bedtime.      . multivitamin-iron-minerals-folic acid (CENTRUM) chewable tablet Chew 1 tablet by mouth daily.        Marland Kitchen SYNTHROID 50 MCG tablet TAKE 1 TABLET BY MOUTH EVERY DAY  30 tablet  0  . TIKOSYN 250 MCG capsule TAKE 1 CAPSULE BY MOUTH 2 TIMES DAILY.  60 capsule  3  . warfarin (COUMADIN) 4 MG tablet TAKE AS DIRECTED BY COUMADIN CLINIC  30 tablet  3  . warfarin (COUMADIN) 5 MG tablet TAKE AS DIRECTED BY COUMADIN CLINIC  30 tablet  3   No current facility-administered medications on file prior to visit.    Allergies  Allergen Reactions  . Codeine Other (See Comments)    unknown  . Escitalopram Oxalate Other (See Comments)    Adverse reaction  . Phenytoin Sodium Extended Other (See Comments)    unknown    Past Medical History  Diagnosis Date  . Atrial fibrillation   . Anxiety   . Hypertension   . Cerebrovascular accident   . Myocardial infarction   . Diverticula of colon   . Malignant  neoplasm of breast     personal history of  . Anemia   . H/O: hysterectomy   . Iron deficiency   . Hyperlipidemia   . Anxiety     Past Surgical History  Procedure Laterality Date  . Cholecystectomy    . Mastectomy      left breast  . Eye surgery      bil-cataract  . Total knee arthroplasty      History  Smoking status  . Never Smoker   Smokeless tobacco  . Never Used    History  Alcohol Use No    Family History  Problem Relation Age of Onset  . Coronary artery disease      family history of   . Heart disease      family history of     Review of Systems: The review of systems is per the HPI.  All other systems were reviewed and are negative.  Physical Exam: BP 146/68  Pulse 62  Ht 5\' 2"  (1.575 m)  Wt 134 lb 8 oz (61.009 kg)  BMI 24.59 kg/m2 Patient is very pleasant and in no acute distress. She has a significant tremor. Skin is warm and dry. Color is normal.  HEENT is unremarkable. Normocephalic/atraumatic. PERRL. Sclera are nonicteric. Neck is  supple. No masses. No JVD. Lungs are clear. Cardiac exam shows a regular rate and rhythm. Abdomen is soft. Extremities are without edema. Gait and ROM are intact. No gross neurologic deficits noted.   LABORATORY DATA:  ECG today demonstrates normal sinus rhythm with a normal ECG. QTC is normal at 414 ms  INR is 2.2.  Assessment / Plan:  1. PAF - remains in sinus. Continue Tikosyn.  Continue Coumadin for anticoagulation.  2. Hypertension, controlled.  3. History of CVA.

## 2013-10-20 ENCOUNTER — Ambulatory Visit: Payer: Medicare Other | Admitting: Pharmacist Clinician (PhC)/ Clinical Pharmacy Specialist

## 2013-10-20 ENCOUNTER — Telehealth: Payer: Self-pay

## 2013-10-20 NOTE — Telephone Encounter (Signed)
Received call from patient's husband he stated wife just had a INR 09/22/13 and INR was 2.3.Stated she did not feel like coming to office this afternoon.Stated he would like to reschedule to 11/03/13.Message sent to [facility] in coumadin clinic.

## 2013-10-25 ENCOUNTER — Telehealth: Payer: Self-pay

## 2013-10-25 MED ORDER — ALPRAZOLAM 0.25 MG PO TABS: ORAL_TABLET | ORAL | Status: DC

## 2013-10-25 MED ORDER — LOSARTAN POTASSIUM 50 MG PO TABS: 50.0000 mg | ORAL_TABLET | Freq: Every day | ORAL | Status: DC

## 2013-10-25 NOTE — Telephone Encounter (Signed)
Received call from patient's husband.He stated Dr.Jordan said he could give wife alprazolam 0.25 mg three times a day if needed.Stated she normally takes 0.25 mg twice a day but on occasion will take three times a day.Refill phoned to pharmacy.Also losartan refill sent to pharmacy.

## 2013-10-29 ENCOUNTER — Other Ambulatory Visit: Payer: Medicare Other | Admitting: Internal Medicine

## 2013-10-29 DIAGNOSIS — E785 Hyperlipidemia, unspecified: Secondary | ICD-10-CM

## 2013-10-29 DIAGNOSIS — I10 Essential (primary) hypertension: Secondary | ICD-10-CM

## 2013-10-29 DIAGNOSIS — D649 Anemia, unspecified: Secondary | ICD-10-CM

## 2013-10-29 DIAGNOSIS — E039 Hypothyroidism, unspecified: Secondary | ICD-10-CM

## 2013-10-29 DIAGNOSIS — E559 Vitamin D deficiency, unspecified: Secondary | ICD-10-CM

## 2013-10-29 DIAGNOSIS — Z0000 Encounter for general adult medical examination without abnormal findings: Secondary | ICD-10-CM

## 2013-10-29 LAB — LIPID PANEL
CHOL/HDL RATIO: 3.8 ratio
CHOLESTEROL: 222 mg/dL — AB (ref 0–200)
HDL: 59 mg/dL (ref >39)
LDL Cholesterol: 126 mg/dL — ABNORMAL HIGH (ref 0–99)
TRIGLYCERIDES: 187 mg/dL — AB (ref <150)
VLDL: 37 mg/dL (ref 0–40)

## 2013-10-29 LAB — CBC WITH DIFFERENTIAL/PLATELET
Basophils Absolute: 0.1 10*3/uL (ref 0.0–0.1)
Basophils Relative: 1 % (ref 0–1)
EOS PCT: 8 % — AB (ref 0–5)
Eosinophils Absolute: 0.6 10*3/uL (ref 0.0–0.7)
HCT: 39.2 % (ref 36.0–46.0)
HEMOGLOBIN: 13.4 g/dL (ref 12.0–15.0)
LYMPHS PCT: 31 % (ref 12–46)
Lymphs Abs: 2.4 10*3/uL (ref 0.7–4.0)
MCH: 28.7 pg (ref 26.0–34.0)
MCHC: 34.2 g/dL (ref 30.0–36.0)
MCV: 83.9 fL (ref 78.0–100.0)
MONO ABS: 0.6 10*3/uL (ref 0.1–1.0)
Monocytes Relative: 8 % (ref 3–12)
NEUTROS ABS: 4.1 10*3/uL (ref 1.7–7.7)
Neutrophils Relative %: 52 % (ref 43–77)
Platelets: 315 10*3/uL (ref 150–400)
RBC: 4.67 MIL/uL (ref 3.87–5.11)
RDW: 14.8 % (ref 11.5–15.5)
WBC: 7.8 10*3/uL (ref 4.0–10.5)

## 2013-10-29 LAB — COMPREHENSIVE METABOLIC PANEL
ALBUMIN: 4.2 g/dL (ref 3.5–5.2)
ALT: 13 U/L (ref 0–35)
AST: 16 U/L (ref 0–37)
Alkaline Phosphatase: 72 U/L (ref 39–117)
BUN: 19 mg/dL (ref 6–23)
CALCIUM: 10.3 mg/dL (ref 8.4–10.5)
CHLORIDE: 103 meq/L (ref 96–112)
CO2: 23 mEq/L (ref 19–32)
Creat: 0.83 mg/dL (ref 0.50–1.10)
GLUCOSE: 93 mg/dL (ref 70–99)
POTASSIUM: 4.4 meq/L (ref 3.5–5.3)
Sodium: 139 mEq/L (ref 135–145)
Total Bilirubin: 0.6 mg/dL (ref 0.2–1.2)
Total Protein: 7 g/dL (ref 6.0–8.3)

## 2013-10-30 LAB — VITAMIN D 25 HYDROXY (VIT D DEFICIENCY, FRACTURES): Vit D, 25-Hydroxy: 41 ng/mL (ref 30–89)

## 2013-10-30 LAB — TSH: TSH: 3.073 u[IU]/mL (ref 0.350–4.500)

## 2013-11-01 ENCOUNTER — Encounter: Payer: Self-pay | Admitting: Internal Medicine

## 2013-11-01 ENCOUNTER — Ambulatory Visit (INDEPENDENT_AMBULATORY_CARE_PROVIDER_SITE_OTHER): Payer: Medicare Other | Admitting: Internal Medicine

## 2013-11-01 VITALS — BP 156/78 | HR 80 | Ht 62.0 in | Wt 138.0 lb

## 2013-11-01 DIAGNOSIS — F4000 Agoraphobia, unspecified: Secondary | ICD-10-CM

## 2013-11-01 DIAGNOSIS — Z8669 Personal history of other diseases of the nervous system and sense organs: Secondary | ICD-10-CM

## 2013-11-01 DIAGNOSIS — Z7901 Long term (current) use of anticoagulants: Secondary | ICD-10-CM

## 2013-11-01 DIAGNOSIS — E039 Hypothyroidism, unspecified: Secondary | ICD-10-CM

## 2013-11-01 DIAGNOSIS — Z8639 Personal history of other endocrine, nutritional and metabolic disease: Secondary | ICD-10-CM

## 2013-11-01 DIAGNOSIS — Z853 Personal history of malignant neoplasm of breast: Secondary | ICD-10-CM

## 2013-11-01 DIAGNOSIS — F411 Generalized anxiety disorder: Secondary | ICD-10-CM

## 2013-11-01 DIAGNOSIS — Z862 Personal history of diseases of the blood and blood-forming organs and certain disorders involving the immune mechanism: Secondary | ICD-10-CM

## 2013-11-01 DIAGNOSIS — Z8679 Personal history of other diseases of the circulatory system: Secondary | ICD-10-CM

## 2013-11-01 DIAGNOSIS — Z0000 Encounter for general adult medical examination without abnormal findings: Secondary | ICD-10-CM

## 2013-11-01 DIAGNOSIS — J309 Allergic rhinitis, unspecified: Secondary | ICD-10-CM

## 2013-11-02 NOTE — Progress Notes (Signed)
Subjective:    Patient ID: [name] [name], female    DOB: 06-29-1920, 80 y.o.   MRN: [id number]  HPI 80 year old White Female in today at my request for health maintenance exam and evaluation of medical issues. Family reports she was reluctant to come to the doctor. Seems to make her anxious. She's been followed here since 1993. She is followed closely by Dr. Peter Martinique, cardiologist.  She developed hypertension around 1983, 1996 she developed cardiomyopathy with congestive heart failure and was found to have a left lacunar infarction. Cardiac catheterization at that time was negative. She has a history of atrial fibrillation.  For a number of years she hasn't really wanted to leave her house saying she didn't have anything to wear. Has been unwilling decease a contra's. Long-standing history of anxiety. Likely has dementia. She will go to the beauty shop on a weekly basis. She and her husband continue to live independently with help from family.  History of right breast cancer diagnosed 29. Had left mastectomy January 2001. Has had right knee replacement by Dr. Maureen Ralphs in July 2005. Bilateral cataract extractions in 2006. Tonsillectomy in the late 1930s. Hysterectomy with partial oophorectomy 1956. Benign left breast biopsy 1985. Cholecystectomy 1989. History of lumbar nerve root impingement L4-L5 November 2005. History of deficiency anemia 2008. History of PVCs. History of macular degeneration. History of hypothyroidism, vitamin D deficiency, allergic rhinitis. Has been evaluated for mild hypercalcemia with normal intact parathyroid hormone assay in 2011 when calcium was 11.5. Serum calcium checked in February 2012 was 9.9. Has been evaluated for dementia in 2010 with normal B-12 and folate levels. MRI of the brain with and without contrast done and was unremarkable except for atrophy and small vessel disease.  No known drug allergies but may have had an adverse reaction to Lexapro. Apparently  developed a rash on Dilantin. History of intolerance to amiodarone and Flecainide.  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 Husband retired from [employer]. 2 adult children. Does not smoke or consume alcohol.  Family history: Father died at age 55 as result of a diving accident in which his neck was fractured. Mother died at age 54 with congestive heart failure. 2 brothers one of them have prostate cancer. No sisters.    Review of Systems  Constitutional: Negative.   Psychiatric/Behavioral:       Anxious, has little to say, does not want to be and doctor's office.       Objective:   Physical Exam  Constitutional: She is oriented to person, place, and time. She appears well-developed and well-nourished.  HENT:  Head: Normocephalic and atraumatic.  Right Ear: External ear normal.  Left Ear: External ear normal.  Mouth/Throat: Oropharynx is clear and moist. No oropharyngeal exudate.  Eyes: Conjunctivae and EOM are normal. Left eye exhibits no discharge. No scleral icterus.  Neck: Neck supple. No JVD present. No thyromegaly present.  Cardiovascular: Normal rate and regular rhythm.   Pulmonary/Chest: Effort normal and breath sounds normal. She has no wheezes. She has no rales.  Abdominal: Soft. Bowel sounds are normal. She exhibits no distension. There is no tenderness. There is no rebound.  Genitourinary:  Deferred  Musculoskeletal: She exhibits no edema.  Lymphadenopathy:    She has no cervical adenopathy.  Neurological: She is alert and oriented to person, place, and time. She has normal reflexes. She displays normal reflexes. No cranial nerve deficit. Coordination normal.  Skin: Skin is  warm and dry. No rash noted.  Psychiatric:  She is alert and oriented 3.  Vitals reviewed.         Assessment & Plan:  Agoraphobia Hypertension History of atrial fibrillation Hypothyroidism History of breast  cancer Anxiety Allergic rhinitis Macular degeneration History of vitamin D deficiency Hyperlipidemia  Plan: Return in one year or as needed. Continue same medications. Refuses to see psychiatrist.    Subjective:   Patient presents for Medicare Annual/Subsequent preventive examination.  Review Past Medical/Family/Social:see above   Risk Factors  Current exercise habits: sedentary Dietary issues discussed: low salt  Cardiac risk factors: Family history, HTN, hyperlipidemia  Depression Screen  (Note: if answer to either of the following is "Yes", a more complete depression screening is indicated)   Over the past two weeks, have you felt down, depressed or hopeless? sometimes Over the past two weeks, have you felt little interest or pleasure in doing things? No Have you lost interest or pleasure in daily life? No Do you often feel hopeless? No Do you cry easily over simple problems? sometimes  Activities of Daily Living  In your present state of health, do you have any difficulty performing the following activities?:   Driving? Not driving Managing money? No  Feeding yourself? No  Getting from bed to chair? No  Climbing a flight of stairs? No  Preparing food and eating?: No  Bathing or showering? No  Getting dressed: No  Getting to the toilet? No  Using the toilet:No  Moving around from place to place: No  In the past year have you fallen or had a near fall?:No  Are you sexually active? No  Do you have more than one partner? No   Hearing Difficulties: No  Do you often ask people to speak up or repeat themselves? yes Do you experience ringing or noises in your ears? No  Do you have difficulty understanding soft or whispered voices? yes Do you feel that you have a problem with memory? sometimes Do you often misplace items? No    Home Safety:  Do you have a smoke alarm at your residence? Yes Do you have grab bars in the bathroom? No Do you have throw rugs in your  house?   Cognitive Testing  Alert? Yes Normal Appearance?Yes  Oriented to person? Yes Place? Yes  Time? Yes  Recall of three objects?  Can perform simple calculations? Yes  Displays appropriate judgment?Yes  Can read the correct time from a watch face?Yes   List the Names of Other Physician/Practitioners you currently use:  See referral list for the physicians patient is currently seeing.   Dr. Martinique, cardiology  Review of Systems: See above  Objective:     General appearance: Appears younger than stated age Head: Normocephalic, without obvious abnormality, atraumatic  Eyes: conj clear, EOMi PEERLA  Ears: normal TM's and external ear canals both ears  Nose: Nares normal. Septum midline. Mucosa normal. No drainage or sinus tenderness.  Throat: lips, mucosa, and tongue normal; teeth and gums normal  Neck: no adenopathy, no carotid bruit, no JVD, supple, symmetrical, trachea midline and thyroid not enlarged, symmetric, no tenderness/mass/nodules  No CVA tenderness.  Lungs: clear to auscultation bilaterally   Heart: regular rate and rhythm, S1, S2 normal, no murmur, click, rub or gallop  Abdomen: soft, non-tender; bowel sounds normal; no masses, no organomegaly  Musculoskeletal: ROM normal in all joints, no crepitus, no deformity, Normal muscle strengthen. Back  is symmetric, no curvature. Skin: Skin color, texture,  turgor normal. No rashes or lesions  Lymph nodes: Cervical, supraclavicular, and axillary nodes normal.  Neurologic: CN 2 -12 Normal, Normal symmetric reflexes. Normal coordination and gait  Psych: Alert & Oriented x 3, Mood appear stable.    Assessment:    Annual wellness medicare exam   Plan:    During the course of the visit the patient was educated and counseled about appropriate screening and preventive services including:  Yearly eye exam     Patient Instructions (the written plan) was given to the patient.  Medicare Attestation  I have  personally reviewed:  The patient's medical and social history  Their use of alcohol, tobacco or illicit drugs  Their current medications and supplements  The patient's functional ability including ADLs,fall risks, home safety risks, cognitive, and hearing and visual impairment  Diet and physical activities  Evidence for depression or mood disorders  The patient's weight, height, BMI, and visual acuity have been recorded in the chart. I have made referrals, counseling, and provided education to the patient based on review of the above and I have provided the patient with a written personalized care plan for preventive services.

## 2013-11-03 ENCOUNTER — Ambulatory Visit (INDEPENDENT_AMBULATORY_CARE_PROVIDER_SITE_OTHER): Payer: Medicare Other | Admitting: Pharmacist Clinician (PhC)/ Clinical Pharmacy Specialist

## 2013-11-03 DIAGNOSIS — I4891 Unspecified atrial fibrillation: Secondary | ICD-10-CM

## 2013-11-03 LAB — POCT INR: INR: 2.4

## 2013-11-06 ENCOUNTER — Other Ambulatory Visit: Payer: Self-pay | Admitting: Cardiovascular Disease

## 2013-11-08 ENCOUNTER — Telehealth: Payer: Self-pay | Admitting: Cardiology

## 2013-11-08 NOTE — Telephone Encounter (Signed)
Manus Gunning was returning Cheryl's call in regards to Alprazolam . Please call back  Thanks

## 2013-11-08 NOTE — Telephone Encounter (Signed)
Returned call to patient's daughter n law [name] [name].She was wanting to know if Alprazolam refill had been called in.Advised refill ready for pick up.

## 2013-11-26 ENCOUNTER — Other Ambulatory Visit: Payer: Self-pay

## 2013-11-27 ENCOUNTER — Other Ambulatory Visit: Payer: Self-pay | Admitting: Internal Medicine

## 2013-12-15 ENCOUNTER — Ambulatory Visit (INDEPENDENT_AMBULATORY_CARE_PROVIDER_SITE_OTHER): Payer: Medicare Other | Admitting: Pharmacist Clinician (PhC)/ Clinical Pharmacy Specialist

## 2013-12-15 DIAGNOSIS — I4891 Unspecified atrial fibrillation: Secondary | ICD-10-CM

## 2013-12-15 LAB — POCT INR: INR: 1.5

## 2013-12-17 ENCOUNTER — Other Ambulatory Visit: Payer: Self-pay

## 2013-12-17 MED ORDER — DOFETILIDE 250 MCG PO CAPS: ORAL_CAPSULE | ORAL | Status: DC

## 2013-12-29 ENCOUNTER — Ambulatory Visit (INDEPENDENT_AMBULATORY_CARE_PROVIDER_SITE_OTHER): Payer: Medicare Other | Admitting: Pharmacist Clinician (PhC)/ Clinical Pharmacy Specialist

## 2013-12-29 DIAGNOSIS — I4891 Unspecified atrial fibrillation: Secondary | ICD-10-CM

## 2013-12-29 LAB — POCT INR: INR: 2.1

## 2014-01-07 ENCOUNTER — Encounter: Payer: Self-pay | Admitting: Internal Medicine

## 2014-01-07 NOTE — Patient Instructions (Signed)
Continue same medications and return in one year. 

## 2014-01-17 ENCOUNTER — Telehealth: Payer: Self-pay

## 2014-01-17 NOTE — Telephone Encounter (Signed)
Patient says she received flu vaccine in the office.  Date unknown.

## 2014-01-26 ENCOUNTER — Ambulatory Visit (INDEPENDENT_AMBULATORY_CARE_PROVIDER_SITE_OTHER): Payer: Medicare Other | Admitting: Pharmacist Clinician (PhC)/ Clinical Pharmacy Specialist

## 2014-01-26 DIAGNOSIS — I4891 Unspecified atrial fibrillation: Secondary | ICD-10-CM

## 2014-01-26 LAB — POCT INR: INR: 2.1

## 2014-02-12 ENCOUNTER — Other Ambulatory Visit: Payer: Self-pay | Admitting: Internal Medicine

## 2014-03-09 ENCOUNTER — Ambulatory Visit (INDEPENDENT_AMBULATORY_CARE_PROVIDER_SITE_OTHER): Payer: 59 | Admitting: Pharmacist Clinician (PhC)/ Clinical Pharmacy Specialist

## 2014-03-09 DIAGNOSIS — I4891 Unspecified atrial fibrillation: Secondary | ICD-10-CM

## 2014-03-09 LAB — POCT INR: INR: 2

## 2014-04-18 ENCOUNTER — Other Ambulatory Visit: Payer: Self-pay | Admitting: *Deleted

## 2014-04-18 MED ORDER — AMLODIPINE BESYLATE 5 MG PO TABS: 5.0000 mg | ORAL_TABLET | Freq: Every day | ORAL | Status: DC

## 2014-04-20 ENCOUNTER — Ambulatory Visit (INDEPENDENT_AMBULATORY_CARE_PROVIDER_SITE_OTHER): Payer: Medicare Other | Admitting: Pharmacist Clinician (PhC)/ Clinical Pharmacy Specialist

## 2014-04-20 DIAGNOSIS — I480 Paroxysmal atrial fibrillation: Secondary | ICD-10-CM

## 2014-04-20 DIAGNOSIS — I4891 Unspecified atrial fibrillation: Secondary | ICD-10-CM

## 2014-04-20 LAB — POCT INR: INR: 2.6

## 2014-05-03 ENCOUNTER — Other Ambulatory Visit: Payer: Self-pay

## 2014-05-03 MED ORDER — DOFETILIDE 250 MCG PO CAPS: ORAL_CAPSULE | ORAL | Status: DC

## 2014-05-03 MED ORDER — POTASSIUM CHLORIDE CRYS ER 20 MEQ PO TBCR: 20.0000 meq | EXTENDED_RELEASE_TABLET | Freq: Every day | ORAL | Status: DC

## 2014-05-09 ENCOUNTER — Ambulatory Visit (INDEPENDENT_AMBULATORY_CARE_PROVIDER_SITE_OTHER): Payer: 59 | Admitting: Cardiology

## 2014-05-09 ENCOUNTER — Encounter: Payer: Self-pay | Admitting: Cardiology

## 2014-05-09 VITALS — BP 160/62 | HR 86 | Ht 63.0 in | Wt 139.1 lb

## 2014-05-09 DIAGNOSIS — I480 Paroxysmal atrial fibrillation: Secondary | ICD-10-CM | POA: Diagnosis not present

## 2014-05-09 DIAGNOSIS — E785 Hyperlipidemia, unspecified: Secondary | ICD-10-CM

## 2014-05-09 DIAGNOSIS — I10 Essential (primary) hypertension: Secondary | ICD-10-CM | POA: Diagnosis not present

## 2014-05-09 DIAGNOSIS — Z7901 Long term (current) use of anticoagulants: Secondary | ICD-10-CM

## 2014-05-09 NOTE — Patient Instructions (Signed)
Continue your current therapy  I will see you in 6 months with Ecg

## 2014-05-09 NOTE — Progress Notes (Signed)
[name] [name] Date of Birth: 07-22-1920 Medical Record #[id number]  History of Present Illness: [name] [name] is seen for follow up atrial fibrillation. She has a history of atrial fib. Has been on Tikosyn and remains on her coumadin. She is intolerant of amiodarone. Other issues include anxiety, HTN, past CVA, HLD, iron deficiency anemia and prior cardiomyopathy. She has a chronic tremor. She denies any cardiac complaints. She denies any palpitations, shortness of breath, or dizziness. She has done extremely well on Tikosyn. She has had no chest pain.    Current Outpatient Prescriptions on File Prior to Visit  Medication Sig Dispense Refill  . ALPRAZolam (XANAX) 0.25 MG tablet Take 0.25 mg three times a day if needed for anxiety 90 tablet 5  . amLODipine (NORVASC) 5 MG tablet Take 1 tablet (5 mg total) by mouth at bedtime. 30 tablet 0  . dofetilide (TIKOSYN) 250 MCG capsule TAKE 1 CAPSULE BY MOUTH 2 TIMES DAILY. 60 capsule 3  . losartan (COZAAR) 50 MG tablet Take 1 tablet (50 mg total) by mouth daily. 90 tablet 3  . Multiple Vitamins-Minerals (ICAPS PO) Take 1 capsule by mouth at bedtime.    . multivitamin-iron-minerals-folic acid (CENTRUM) chewable tablet Chew 1 tablet by mouth daily.      Marland Kitchen OLANZapine (ZYPREXA) 5 MG tablet TAKE 1 TABLET (5 MG TOTAL) BY MOUTH AT BEDTIME. 30 tablet 5  . potassium chloride SA (KLOR-CON M20) 20 MEQ tablet Take 1 tablet (20 mEq total) by mouth daily. 30 tablet 5  . SYNTHROID 50 MCG tablet TAKE 1 TABLET BY MOUTH EVERY DAY 30 tablet 11  . warfarin (COUMADIN) 4 MG tablet TAKE AS DIRECTED BY COUMADIN CLINIC 30 tablet 3  . warfarin (COUMADIN) 5 MG tablet TAKE AS DIRECTED BY COUMADIN CLINIC 30 tablet 3   No current facility-administered medications on file prior to visit.    Allergies  Allergen Reactions  . Codeine Other (See Comments)    unknown  . Escitalopram Oxalate Other (See Comments)    Adverse reaction  . Phenytoin Sodium Extended Other (See Comments)   unknown    Past Medical History  Diagnosis Date  . Atrial fibrillation   . Anxiety   . Hypertension   . Cerebrovascular accident   . Myocardial infarction   . Diverticula of colon   . Malignant neoplasm of breast     personal history of  . Anemia   . H/O: hysterectomy   . Iron deficiency   . Hyperlipidemia   . Anxiety     Past Surgical History  Procedure Laterality Date  . Cholecystectomy    . Mastectomy      left breast  . Eye surgery      bil-cataract  . Total knee arthroplasty      History  Smoking status  . Never Smoker   Smokeless tobacco  . Never Used    History  Alcohol Use No    Family History  Problem Relation Age of Onset  . Coronary artery disease      family history of   . Heart disease Mother   . Hypertension Mother   . Healthy Father 43    neck fracture  . Cancer Brother     prostate    Review of Systems: The review of systems is per the HPI.  All other systems were reviewed and are negative.  Physical Exam: BP 160/62 mmHg  Pulse 86  Ht 5\' 3"  (1.6 m)  Wt 139 lb 1.6 oz (63.095  kg)  BMI 24.65 kg/m2 Patient is very pleasant and in no acute distress. She has a significant tremor. Skin is warm and dry. Color is normal.  HEENT is unremarkable. Normocephalic/atraumatic. PERRL. Sclera are nonicteric. Neck is supple. No masses. No JVD. Lungs are clear. Cardiac exam shows a regular rate and rhythm. Abdomen is soft. Extremities are without edema. Gait and ROM are intact. No gross neurologic deficits noted.   LABORATORY DATA:  Lab Results  Component Value Date   WBC 7.8 10/29/2013   HGB 13.4 10/29/2013   HCT 39.2 10/29/2013   PLT 315 10/29/2013   GLUCOSE 93 10/29/2013   CHOL 222* 10/29/2013   TRIG 187* 10/29/2013   HDL 59 10/29/2013   LDLCALC 126* 10/29/2013   ALT 13 10/29/2013   AST 16 10/29/2013   NA 139 10/29/2013   K 4.4 10/29/2013   CL 103 10/29/2013   CREATININE 0.83 10/29/2013   BUN 19 10/29/2013   CO2 23 10/29/2013   TSH  3.073 10/29/2013   INR 2.6 04/20/2014     INR is 2.6  Assessment / Plan:  1. PAF - remains in sinus. Continue Tikosyn.  Continue Coumadin for anticoagulation. I will follow up in 6 months with Ecg.   2. Hypertension, controlled.  3. History of CVA.  4. Chronic anticoagulation

## 2014-06-01 ENCOUNTER — Ambulatory Visit (INDEPENDENT_AMBULATORY_CARE_PROVIDER_SITE_OTHER): Payer: Medicare Other | Admitting: Pharmacist Clinician (PhC)/ Clinical Pharmacy Specialist

## 2014-06-01 DIAGNOSIS — I4891 Unspecified atrial fibrillation: Secondary | ICD-10-CM | POA: Diagnosis not present

## 2014-06-01 LAB — POCT INR: INR: 2.2

## 2014-06-10 ENCOUNTER — Telehealth: Payer: Self-pay

## 2014-06-10 MED ORDER — AMLODIPINE BESYLATE 5 MG PO TABS: 5.0000 mg | ORAL_TABLET | Freq: Every day | ORAL | Status: DC

## 2014-06-10 NOTE — Telephone Encounter (Signed)
Received a call from patient's husband he stated wife needed refill for amlodipine.90 day refill sent to pharmacy.

## 2014-06-30 ENCOUNTER — Telehealth: Payer: Self-pay

## 2014-06-30 MED ORDER — ALPRAZOLAM 0.25 MG PO TABS: ORAL_TABLET | ORAL | Status: DC

## 2014-06-30 NOTE — Telephone Encounter (Signed)
Alprazolam refill called to pharmacy.

## 2014-07-13 ENCOUNTER — Ambulatory Visit (INDEPENDENT_AMBULATORY_CARE_PROVIDER_SITE_OTHER): Payer: Medicare Other | Admitting: Pharmacist Clinician (PhC)/ Clinical Pharmacy Specialist

## 2014-07-13 DIAGNOSIS — I4891 Unspecified atrial fibrillation: Secondary | ICD-10-CM | POA: Diagnosis not present

## 2014-07-13 LAB — POCT INR: INR: 1.8

## 2014-08-01 ENCOUNTER — Other Ambulatory Visit: Payer: Self-pay | Admitting: Internal Medicine

## 2014-08-01 ENCOUNTER — Other Ambulatory Visit: Payer: Self-pay | Admitting: *Deleted

## 2014-08-01 MED ORDER — WARFARIN SODIUM 5 MG PO TABS: ORAL_TABLET | ORAL | Status: DC

## 2014-08-01 NOTE — Telephone Encounter (Signed)
ERX sent to pharmacy 

## 2014-08-03 ENCOUNTER — Other Ambulatory Visit: Payer: Self-pay

## 2014-08-03 DIAGNOSIS — Z1231 Encounter for screening mammogram for malignant neoplasm of breast: Secondary | ICD-10-CM

## 2014-08-08 ENCOUNTER — Telehealth: Payer: Self-pay

## 2014-08-08 ENCOUNTER — Other Ambulatory Visit: Payer: Self-pay

## 2014-08-08 NOTE — Telephone Encounter (Signed)
Patient's son called he stated father presently in [place] hospital.Stated he was requesting refill for brand name xanax for mother.Spoke to pharmacist at [place] she stated patient has always got generic xanax and she had refills.

## 2014-08-10 ENCOUNTER — Ambulatory Visit (INDEPENDENT_AMBULATORY_CARE_PROVIDER_SITE_OTHER): Payer: Medicare Other | Admitting: Pharmacist Clinician (PhC)/ Clinical Pharmacy Specialist

## 2014-08-10 DIAGNOSIS — I4891 Unspecified atrial fibrillation: Secondary | ICD-10-CM

## 2014-08-10 LAB — POCT INR: INR: 1.8

## 2014-08-11 ENCOUNTER — Ambulatory Visit: Payer: Medicare Other

## 2014-08-24 ENCOUNTER — Other Ambulatory Visit: Payer: Self-pay

## 2014-08-24 MED ORDER — DOFETILIDE 250 MCG PO CAPS: ORAL_CAPSULE | ORAL | Status: DC

## 2014-08-29 ENCOUNTER — Ambulatory Visit (INDEPENDENT_AMBULATORY_CARE_PROVIDER_SITE_OTHER): Payer: Medicare Other | Admitting: Pharmacist Clinician (PhC)/ Clinical Pharmacy Specialist

## 2014-08-29 DIAGNOSIS — I4891 Unspecified atrial fibrillation: Secondary | ICD-10-CM

## 2014-08-29 LAB — POCT INR: INR: 2.3

## 2014-09-26 ENCOUNTER — Ambulatory Visit (INDEPENDENT_AMBULATORY_CARE_PROVIDER_SITE_OTHER): Payer: Medicare Other | Admitting: Pharmacist Clinician (PhC)/ Clinical Pharmacy Specialist

## 2014-09-26 DIAGNOSIS — I4891 Unspecified atrial fibrillation: Secondary | ICD-10-CM | POA: Diagnosis not present

## 2014-09-26 LAB — POCT INR: INR: 2.5

## 2014-10-14 ENCOUNTER — Other Ambulatory Visit: Payer: Self-pay | Admitting: Cardiology

## 2014-10-14 MED ORDER — POTASSIUM CHLORIDE CRYS ER 20 MEQ PO TBCR: 20.0000 meq | EXTENDED_RELEASE_TABLET | Freq: Every day | ORAL | Status: DC

## 2014-10-24 ENCOUNTER — Other Ambulatory Visit: Payer: Self-pay | Admitting: Cardiology

## 2014-10-24 MED ORDER — LOSARTAN POTASSIUM 50 MG PO TABS
50.0000 mg | ORAL_TABLET | Freq: Every day | ORAL | Status: DC
Start: 2014-10-24 — End: 2015-01-23

## 2014-10-28 ENCOUNTER — Other Ambulatory Visit: Payer: Self-pay | Admitting: Internal Medicine

## 2014-11-05 ENCOUNTER — Other Ambulatory Visit: Payer: Self-pay | Admitting: Internal Medicine

## 2014-11-07 ENCOUNTER — Ambulatory Visit (INDEPENDENT_AMBULATORY_CARE_PROVIDER_SITE_OTHER): Payer: Medicare Other | Admitting: Pharmacist Clinician (PhC)/ Clinical Pharmacy Specialist

## 2014-11-07 DIAGNOSIS — I4891 Unspecified atrial fibrillation: Secondary | ICD-10-CM

## 2014-11-07 LAB — POCT INR: INR: 2.9

## 2014-11-11 ENCOUNTER — Other Ambulatory Visit: Payer: Self-pay | Admitting: *Deleted

## 2014-11-11 MED ORDER — POTASSIUM CHLORIDE CRYS ER 20 MEQ PO TBCR: 20.0000 meq | EXTENDED_RELEASE_TABLET | Freq: Every day | ORAL | Status: DC

## 2014-11-16 ENCOUNTER — Telehealth: Payer: Self-pay | Admitting: Cardiology

## 2014-11-16 NOTE — Telephone Encounter (Signed)
Spoke with Mr. Montavon.   He states nosebleed only lasted a few minutes, but they were worried about her being up and around with nosebleed.  He states she has no dizziness or lightheadedness.  I assured him that this was probably an isolated event and that she should be fine to go out tomorrow to her hairdresser, but if she felt weak or unsteady she should stay home.  Husband voiced understanding.

## 2014-11-16 NOTE — Telephone Encounter (Signed)
Returned call to patient's husband.He stated his wife had a nose bleed after lunch today.He was concerned blood might be too thin since coumadin dose increased.Advised I will send message to our pharmacist Erasmo Downer for advice.

## 2014-11-16 NOTE — Telephone Encounter (Signed)
Returning your call. °

## 2014-12-01 ENCOUNTER — Other Ambulatory Visit: Payer: Self-pay | Admitting: Pharmacist

## 2014-12-01 MED ORDER — WARFARIN SODIUM 5 MG PO TABS: ORAL_TABLET | ORAL | Status: DC

## 2014-12-08 ENCOUNTER — Other Ambulatory Visit: Payer: Self-pay | Admitting: Cardiology

## 2014-12-17 ENCOUNTER — Other Ambulatory Visit: Payer: Self-pay | Admitting: Cardiology

## 2014-12-19 ENCOUNTER — Ambulatory Visit (INDEPENDENT_AMBULATORY_CARE_PROVIDER_SITE_OTHER): Payer: Medicare Other | Admitting: Pharmacist Clinician (PhC)/ Clinical Pharmacy Specialist

## 2014-12-19 DIAGNOSIS — I4891 Unspecified atrial fibrillation: Secondary | ICD-10-CM

## 2014-12-19 LAB — POCT INR: INR: 3

## 2014-12-24 ENCOUNTER — Other Ambulatory Visit: Payer: Self-pay | Admitting: Cardiology

## 2015-01-03 ENCOUNTER — Other Ambulatory Visit: Payer: Self-pay | Admitting: Cardiology

## 2015-01-21 ENCOUNTER — Other Ambulatory Visit: Payer: Self-pay | Admitting: Internal Medicine

## 2015-01-23 ENCOUNTER — Other Ambulatory Visit: Payer: Self-pay | Admitting: Cardiology

## 2015-01-23 MED ORDER — LOSARTAN POTASSIUM 50 MG PO TABS: 50.0000 mg | ORAL_TABLET | Freq: Every day | ORAL | Status: DC

## 2015-01-30 ENCOUNTER — Ambulatory Visit (INDEPENDENT_AMBULATORY_CARE_PROVIDER_SITE_OTHER): Payer: Medicare Other | Admitting: Pharmacist Clinician (PhC)/ Clinical Pharmacy Specialist

## 2015-01-30 DIAGNOSIS — I4891 Unspecified atrial fibrillation: Secondary | ICD-10-CM

## 2015-01-30 LAB — POCT INR: INR: 2.7

## 2015-02-18 ENCOUNTER — Other Ambulatory Visit: Payer: Self-pay | Admitting: Internal Medicine

## 2015-02-20 ENCOUNTER — Other Ambulatory Visit: Payer: Self-pay | Admitting: Cardiology

## 2015-02-20 MED ORDER — WARFARIN SODIUM 4 MG PO TABS: ORAL_TABLET | ORAL | Status: DC

## 2015-03-11 ENCOUNTER — Other Ambulatory Visit: Payer: Self-pay | Admitting: Cardiology

## 2015-03-13 ENCOUNTER — Ambulatory Visit (INDEPENDENT_AMBULATORY_CARE_PROVIDER_SITE_OTHER): Payer: Medicare Other | Admitting: Pharmacist Clinician (PhC)/ Clinical Pharmacy Specialist

## 2015-03-13 DIAGNOSIS — I4891 Unspecified atrial fibrillation: Secondary | ICD-10-CM | POA: Diagnosis not present

## 2015-03-13 LAB — POCT INR: INR: 3.3

## 2015-03-14 ENCOUNTER — Other Ambulatory Visit: Payer: Self-pay

## 2015-03-14 MED ORDER — ALPRAZOLAM 0.25 MG PO TABS: ORAL_TABLET | ORAL | Status: DC

## 2015-03-31 ENCOUNTER — Inpatient Hospital Stay (HOSPITAL_COMMUNITY)
Admission: EM | Admit: 2015-03-31 | Discharge: 2015-04-01 | DRG: 309 | Disposition: A | Discharge status: Home / Self Care | Payer: Medicare Other | Attending: Cardiology | Admitting: Cardiology

## 2015-03-31 ENCOUNTER — Encounter (HOSPITAL_COMMUNITY): Payer: Self-pay | Admitting: Cardiology

## 2015-03-31 DIAGNOSIS — Z90710 Acquired absence of both cervix and uterus: Secondary | ICD-10-CM | POA: Diagnosis not present

## 2015-03-31 DIAGNOSIS — R55 Syncope and collapse: Secondary | ICD-10-CM | POA: Diagnosis present

## 2015-03-31 DIAGNOSIS — Z888 Allergy status to other drugs, medicaments and biological substances status: Secondary | ICD-10-CM

## 2015-03-31 DIAGNOSIS — I252 Old myocardial infarction: Secondary | ICD-10-CM | POA: Diagnosis not present

## 2015-03-31 DIAGNOSIS — Z8042 Family history of malignant neoplasm of prostate: Secondary | ICD-10-CM

## 2015-03-31 DIAGNOSIS — E785 Hyperlipidemia, unspecified: Secondary | ICD-10-CM | POA: Diagnosis present

## 2015-03-31 DIAGNOSIS — H3530 Unspecified macular degeneration: Secondary | ICD-10-CM | POA: Diagnosis present

## 2015-03-31 DIAGNOSIS — Z9841 Cataract extraction status, right eye: Secondary | ICD-10-CM

## 2015-03-31 DIAGNOSIS — I480 Paroxysmal atrial fibrillation: Principal | ICD-10-CM | POA: Diagnosis present

## 2015-03-31 DIAGNOSIS — R251 Tremor, unspecified: Secondary | ICD-10-CM | POA: Diagnosis present

## 2015-03-31 DIAGNOSIS — Z8249 Family history of ischemic heart disease and other diseases of the circulatory system: Secondary | ICD-10-CM

## 2015-03-31 DIAGNOSIS — Z79899 Other long term (current) drug therapy: Secondary | ICD-10-CM

## 2015-03-31 DIAGNOSIS — Z9049 Acquired absence of other specified parts of digestive tract: Secondary | ICD-10-CM

## 2015-03-31 DIAGNOSIS — N390 Urinary tract infection, site not specified: Secondary | ICD-10-CM | POA: Diagnosis present

## 2015-03-31 DIAGNOSIS — Z96659 Presence of unspecified artificial knee joint: Secondary | ICD-10-CM | POA: Diagnosis present

## 2015-03-31 DIAGNOSIS — Z886 Allergy status to analgesic agent status: Secondary | ICD-10-CM

## 2015-03-31 DIAGNOSIS — Z7901 Long term (current) use of anticoagulants: Secondary | ICD-10-CM | POA: Diagnosis not present

## 2015-03-31 DIAGNOSIS — Z8673 Personal history of transient ischemic attack (TIA), and cerebral infarction without residual deficits: Secondary | ICD-10-CM | POA: Diagnosis not present

## 2015-03-31 DIAGNOSIS — F419 Anxiety disorder, unspecified: Secondary | ICD-10-CM | POA: Diagnosis present

## 2015-03-31 DIAGNOSIS — Z9842 Cataract extraction status, left eye: Secondary | ICD-10-CM

## 2015-03-31 DIAGNOSIS — Z853 Personal history of malignant neoplasm of breast: Secondary | ICD-10-CM

## 2015-03-31 DIAGNOSIS — I4891 Unspecified atrial fibrillation: Secondary | ICD-10-CM | POA: Diagnosis present

## 2015-03-31 DIAGNOSIS — Z9012 Acquired absence of left breast and nipple: Secondary | ICD-10-CM

## 2015-03-31 DIAGNOSIS — F0390 Unspecified dementia without behavioral disturbance: Secondary | ICD-10-CM | POA: Diagnosis present

## 2015-03-31 DIAGNOSIS — I10 Essential (primary) hypertension: Secondary | ICD-10-CM | POA: Diagnosis present

## 2015-03-31 LAB — URINE MICROSCOPIC-ADD ON

## 2015-03-31 LAB — COMPREHENSIVE METABOLIC PANEL
ALBUMIN: 4 g/dL (ref 3.5–5.0)
ALK PHOS: 84 U/L (ref 38–126)
ALT: 15 U/L (ref 14–54)
AST: 21 U/L (ref 15–41)
Anion gap: 12 (ref 5–15)
BILIRUBIN TOTAL: 0.5 mg/dL (ref 0.3–1.2)
BUN: 18 mg/dL (ref 6–20)
CALCIUM: 10.1 mg/dL (ref 8.9–10.3)
CO2: 21 mmol/L — AB (ref 22–32)
Chloride: 106 mmol/L (ref 101–111)
Creatinine, Ser: 1.06 mg/dL — ABNORMAL HIGH (ref 0.44–1.00)
GFR calc Af Amer: 51 mL/min — ABNORMAL LOW (ref >60)
GFR calc non Af Amer: 44 mL/min — ABNORMAL LOW (ref >60)
GLUCOSE: 90 mg/dL (ref 65–99)
Potassium: 4.6 mmol/L (ref 3.5–5.1)
SODIUM: 139 mmol/L (ref 135–145)
TOTAL PROTEIN: 7.6 g/dL (ref 6.5–8.1)

## 2015-03-31 LAB — URINALYSIS, ROUTINE W REFLEX MICROSCOPIC
BILIRUBIN URINE: NEGATIVE
GLUCOSE, UA: NEGATIVE mg/dL
HGB URINE DIPSTICK: NEGATIVE
KETONES UR: NEGATIVE mg/dL
Nitrite: POSITIVE — AB
PH: 7.5 (ref 5.0–8.0)
Protein, ur: NEGATIVE mg/dL
Specific Gravity, Urine: 1.01 (ref 1.005–1.030)

## 2015-03-31 LAB — CBC WITH DIFFERENTIAL/PLATELET
Basophils Absolute: 0.1 10*3/uL (ref 0.0–0.1)
Basophils Relative: 1 %
EOS ABS: 0.4 10*3/uL (ref 0.0–0.7)
Eosinophils Relative: 4 %
HEMATOCRIT: 44.6 % (ref 36.0–46.0)
HEMOGLOBIN: 14.9 g/dL (ref 12.0–15.0)
LYMPHS ABS: 1.7 10*3/uL (ref 0.7–4.0)
Lymphocytes Relative: 18 %
MCH: 29.4 pg (ref 26.0–34.0)
MCHC: 33.4 g/dL (ref 30.0–36.0)
MCV: 88.1 fL (ref 78.0–100.0)
MONOS PCT: 7 %
Monocytes Absolute: 0.6 10*3/uL (ref 0.1–1.0)
NEUTROS ABS: 6.6 10*3/uL (ref 1.7–7.7)
NEUTROS PCT: 70 %
Platelets: 286 10*3/uL (ref 150–400)
RBC: 5.06 MIL/uL (ref 3.87–5.11)
RDW: 14.8 % (ref 11.5–15.5)
WBC: 9.3 10*3/uL (ref 4.0–10.5)

## 2015-03-31 LAB — MAGNESIUM: MAGNESIUM: 2.3 mg/dL (ref 1.7–2.4)

## 2015-03-31 LAB — I-STAT TROPONIN, ED: Troponin i, poc: 0.06 ng/mL (ref 0.00–0.08)

## 2015-03-31 LAB — PROTIME-INR
INR: 2.17 — ABNORMAL HIGH (ref 0.00–1.49)
Prothrombin Time: 24 seconds — ABNORMAL HIGH (ref 11.6–15.2)

## 2015-03-31 LAB — BRAIN NATRIURETIC PEPTIDE: B Natriuretic Peptide: 488 pg/mL — ABNORMAL HIGH (ref 0.0–100.0)

## 2015-03-31 MED ORDER — DOFETILIDE 250 MCG PO CAPS
250.0000 ug | ORAL_CAPSULE | Freq: Two times a day (BID) | ORAL | Status: DC
  Administered 2015-03-31 – 2015-04-01 (×2): 250 ug via ORAL
  Filled 2015-03-31 (×2): qty 1

## 2015-03-31 MED ORDER — ONDANSETRON HCL 4 MG/2ML IJ SOLN: 4.0000 mg | Freq: Four times a day (QID) | INTRAMUSCULAR | Status: DC | PRN

## 2015-03-31 MED ORDER — SODIUM CHLORIDE 0.9% FLUSH
3.0000 mL | Freq: Two times a day (BID) | INTRAVENOUS | Status: DC
  Administered 2015-03-31 – 2015-04-01 (×2): 3 mL via INTRAVENOUS

## 2015-03-31 MED ORDER — WARFARIN - PHARMACIST DOSING INPATIENT: Freq: Every day | Status: DC

## 2015-03-31 MED ORDER — SODIUM CHLORIDE 0.9 % IV SOLN: 250.0000 mL | INTRAVENOUS | Status: DC | PRN

## 2015-03-31 MED ORDER — DEXTROSE 5 % IV SOLN
1.0000 g | Freq: Once | INTRAVENOUS | Status: AC
  Administered 2015-03-31: 1 g via INTRAVENOUS
  Filled 2015-03-31: qty 10

## 2015-03-31 MED ORDER — SODIUM CHLORIDE 0.9% FLUSH
3.0000 mL | INTRAVENOUS | Status: DC | PRN
  Administered 2015-03-31: 3 mL via INTRAVENOUS

## 2015-03-31 MED ORDER — LOSARTAN POTASSIUM 50 MG PO TABS
50.0000 mg | ORAL_TABLET | Freq: Every day | ORAL | Status: DC
  Administered 2015-04-01: 50 mg via ORAL
  Filled 2015-03-31: qty 1

## 2015-03-31 MED ORDER — POTASSIUM CHLORIDE CRYS ER 20 MEQ PO TBCR
20.0000 meq | EXTENDED_RELEASE_TABLET | Freq: Every day | ORAL | Status: DC
  Administered 2015-04-01: 20 meq via ORAL
  Filled 2015-03-31: qty 1

## 2015-03-31 MED ORDER — OLANZAPINE 5 MG PO TABS
5.0000 mg | ORAL_TABLET | Freq: Every day | ORAL | Status: DC
  Administered 2015-03-31: 5 mg via ORAL
  Filled 2015-03-31 (×2): qty 1

## 2015-03-31 MED ORDER — METOPROLOL TARTRATE 1 MG/ML IV SOLN
5.0000 mg | Freq: Once | INTRAVENOUS | Status: AC
  Administered 2015-03-31: 5 mg via INTRAVENOUS
  Filled 2015-03-31: qty 5

## 2015-03-31 MED ORDER — NITROGLYCERIN 0.4 MG SL SUBL: 0.4000 mg | SUBLINGUAL_TABLET | SUBLINGUAL | Status: DC | PRN

## 2015-03-31 MED ORDER — WARFARIN SODIUM 5 MG PO TABS
5.0000 mg | ORAL_TABLET | Freq: Once | ORAL | Status: AC
  Administered 2015-03-31: 5 mg via ORAL
  Filled 2015-03-31: qty 1

## 2015-03-31 MED ORDER — ACETAMINOPHEN 325 MG PO TABS: 650.0000 mg | ORAL_TABLET | ORAL | Status: DC | PRN

## 2015-03-31 MED ORDER — DILTIAZEM HCL 100 MG IV SOLR
5.0000 mg/h | INTRAVENOUS | Status: DC
  Administered 2015-03-31: 5 mg/h via INTRAVENOUS
  Filled 2015-03-31: qty 100

## 2015-03-31 MED ORDER — DILTIAZEM LOAD VIA INFUSION
20.0000 mg | Freq: Once | INTRAVENOUS | Status: AC
  Administered 2015-03-31: 10 mg via INTRAVENOUS
  Filled 2015-03-31: qty 20

## 2015-03-31 MED ORDER — LEVOTHYROXINE SODIUM 50 MCG PO TABS
50.0000 ug | ORAL_TABLET | Freq: Every day | ORAL | Status: DC
  Administered 2015-04-01: 50 ug via ORAL
  Filled 2015-03-31: qty 1

## 2015-03-31 MED ORDER — ALPRAZOLAM 0.25 MG PO TABS: 0.2500 mg | ORAL_TABLET | Freq: Three times a day (TID) | ORAL | Status: DC | PRN

## 2015-03-31 NOTE — Consult Note (Signed)
ANTICOAGULATION CONSULT NOTE - Initial Consult  Pharmacy Consult for warfarin Indication: atrial fibrillation  Labs:  Recent Labs  03/31/15 1210  HGB 14.9  HCT 44.6  PLT 286  LABPROT 24.0*  INR 2.17*  CREATININE 1.06*    Assessment: 82 yo female w/ hx of PAF on Coumadin 5 mg daily except 4 mg on Monday PTA. Pt came in w/ episode of afib with RVR and had been previously controlled for years. INR is therapeutic and CBC is stable. Last dose of Coumadin was yesterday.  Goal of Therapy:  INR 2-3 Monitor platelets by anticoagulation protocol: Yes   Plan:  Coumadin 5 mg PO x1 to keep on home regimen F/u daily INR  Joya San, PharmD Clinical Pharmacy Resident Pager # [phone] 03/31/2015 3:27 PM

## 2015-03-31 NOTE — ED Notes (Signed)
Pt to department via EMS from home- husband reports she was sitting at the table and she had a syncopal episode and was eased to the floor. Pt was A&O on ems arrival. Denies any pain. HR-140 A-fib with hx of the same. Bp- 80 palp- 140 with 500 NS.

## 2015-03-31 NOTE — ED Notes (Signed)
Cardiology present to see patient.

## 2015-03-31 NOTE — Progress Notes (Signed)
Admission note:  Arrival Method: via hospital bed from ED Mental Orientation: Alert and oriented Telemetry: placed on cardiac monitor ccmd notified Assessment: see flowsheet Skin: warm and dry Pain: no complain of pain  Fall Prevention Safety Plan: non skid socks, low bed , call light within reach  Patient has been oriented to the unit, staff and to the room. Will continue to monitor. Oren Beckmann, RN

## 2015-03-31 NOTE — ED Provider Notes (Signed)
CSN: [id number]     Arrival date & time 03/31/15  1122 History   First MD Initiated Contact with Patient 03/31/15 1135     Chief Complaint  Patient presents with  . Loss of Consciousness     (Consider location/radiation/quality/duration/timing/severity/associated sxs/prior Treatment) Patient is a 82 y.o. female presenting with syncope. The history is provided by the patient and the spouse.  Loss of Consciousness Episode history:  Single Most recent episode:  Today Duration:  30 seconds Timing:  Sporadic Progression:  Resolved Chronicity:  New Context: normal activity   Witnessed: yes   Relieved by:  Nothing Worsened by:  Nothing tried Ineffective treatments:  None tried Associated symptoms: diaphoresis and palpitations     Past Medical History  Diagnosis Date  . Atrial fibrillation ([place])   . Anxiety   . Hypertension   . Cerebrovascular accident ([place])   . Myocardial infarction ([place])   . Diverticula of colon   . Malignant neoplasm of breast [facility])     personal history of  . Anemia   . H/O: hysterectomy   . Iron deficiency   . Hyperlipidemia   . Anxiety    Past Surgical History  Procedure Laterality Date  . Cholecystectomy    . Mastectomy      left breast  . Eye surgery      bil-cataract  . Total knee arthroplasty     Family History  Problem Relation Age of Onset  . Coronary artery disease      family history of   . Heart disease Mother   . Hypertension Mother   . Healthy Father 43    neck fracture  . Cancer Brother     prostate   Social History  Substance Use Topics  . Smoking status: Never Smoker   . Smokeless tobacco: Never Used  . Alcohol Use: No   OB History    No data available     Review of Systems  Constitutional: Positive for diaphoresis.  Cardiovascular: Positive for palpitations and syncope.  All other systems reviewed and are negative.     Allergies  Codeine; Escitalopram oxalate; and Phenytoin sodium extended  Home  Medications   Prior to Admission medications   Medication Sig Start Date End Date Taking? Authorizing Provider  ALPRAZolam [name]) 0.25 MG tablet TAKE 1 TABLET BY MOUTH 3 TIMES A DAY AS NEEDED FOR ANXIETY 03/14/15   [name], MD  amLODipine (NORVASC) 5 MG tablet Take 1 tablet (5 mg total) by mouth at bedtime. 06/10/14   [name], MD  KLOR-CON M20 20 MEQ tablet TAKE 1 TABLET BY MOUTH EVERY DAY 01/03/15   [name], MD  losartan (COZAAR) 50 MG tablet Take 1 tablet (50 mg total) by mouth daily. 01/23/15   [name], MD  Multiple Vitamins-Minerals (ICAPS PO) Take 1 capsule by mouth at bedtime.    Historical Provider, MD  multivitamin-iron-minerals-folic acid (CENTRUM) chewable tablet Chew 1 tablet by mouth daily.      Historical Provider, MD  OLANZapine (ZYPREXA) 5 MG tablet TAKE 1 TABLET BY MOUTH AT BEDTIME. 01/22/15   [name], MD  SYNTHROID 50 MCG tablet TAKE 1 TABLET BY MOUTH EVERY DAY 02/19/15   [name], MD  TIKOSYN 250 MCG capsule TAKE 1 CAPSULE BY MOUTH 2 TIMES DAILY. 12/19/14   [name], MD  warfarin (COUMADIN) 4 MG tablet Take 1 tablet by mouth alternating with 5 mg tablets as directed. 02/20/15   Collier Salina  M Martinique, MD  warfarin (COUMADIN) 5 MG tablet TAKE AS DIRECTED BY COUMADIN CLINIC 12/01/14   [name], MD   BP 135/83 mmHg  Pulse 119  Temp(Src) 97.8 F (36.6 C) (Oral)  Resp 23  SpO2 96% Physical Exam  Constitutional: She is oriented to person, place, and time. She appears well-developed and well-nourished. No distress.  Frail, elderly  HENT:  Head: Normocephalic.  Eyes: Conjunctivae are normal.  Neck: Neck supple. No tracheal deviation present.  Cardiovascular: Normal heart sounds.  An irregularly irregular rhythm present. Tachycardia present.   Pulmonary/Chest: Effort normal and breath sounds normal. No respiratory distress.  Abdominal: Soft. She exhibits no distension.  Neurological: She is alert and oriented to person, place, and time.  She has normal strength. No cranial nerve deficit or sensory deficit. GCS eye subscore is 4. GCS verbal subscore is 5. GCS motor subscore is 6.  Skin: Skin is warm and dry.  Psychiatric: She has a normal mood and affect.    ED Course  Procedures (including critical care time)  CRITICAL CARE Performed by: Leo Grosser Total critical care time: 30 minutes Critical care time was exclusive of separately billable procedures and treating other patients. Critical care was necessary to treat or prevent imminent or life-threatening deterioration. Critical care was time spent personally by me on the following activities: development of treatment plan with patient and/or surrogate as well as nursing, discussions with consultants, evaluation of patient's response to treatment, examination of patient, obtaining history from patient or surrogate, ordering and performing treatments and interventions, ordering and review of laboratory studies, ordering and review of radiographic studies, pulse oximetry and re-evaluation of patient's condition.   Labs Review Labs Reviewed  COMPREHENSIVE METABOLIC PANEL - Abnormal; Notable for the following:    CO2 21 (*)    Creatinine, Ser 1.06 (*)    GFR calc non Af Amer 44 (*)    GFR calc Af Amer 51 (*)    All other components within normal limits  BRAIN NATRIURETIC PEPTIDE - Abnormal; Notable for the following:    B Natriuretic Peptide 488.0 (*)    All other components within normal limits  PROTIME-INR - Abnormal; Notable for the following:    Prothrombin Time 24.0 (*)    INR 2.17 (*)    All other components within normal limits  CBC WITH DIFFERENTIAL/PLATELET  MAGNESIUM  URINALYSIS, ROUTINE W REFLEX MICROSCOPIC (NOT AT [facility])  I-STAT TROPOININ, ED  CBG MONITORING, ED    Imaging Review No results found. I have personally reviewed and evaluated these images and lab results as part of my medical decision-making.   EKG Interpretation   Date/Time:  Friday  March 31 2015 11:30:10 EST Ventricular Rate:  131 PR Interval:    QRS Duration: 81 QT Interval:  329 QTC Calculation: 486 R Axis:   -19 Text Interpretation:  Atrial fibrillation with rapid ventricular response  Confirmed by Mizani Dilday MD, Angeliz Settlemyre [id number]) on 03/31/2015 11:45:17 AM      MDM   Final diagnoses:  Atrial fibrillation with RVR ([place])    82 y.o. female presents with syncopal episode at home with Afib/RVR on arrival in rate from 120-150. Pt otherwise HDS. On tikosyn for rhythm control. Attempted IV metoprolol boluses but unable to rate control so started on dilt bolus and drip which controlled nicely. Cardiology consulted for admission, they will see patient in ED and admit for further workup and management. Do not suspect ischemic etiology currently. UA c/w UTI without signs of developing  sepsis which may be underlying arrythmia,  deferred ABx to inpatient team.     Leo Grosser, MD 04/01/15 [phone]

## 2015-03-31 NOTE — H&P (Signed)
Patient ID: [name] [name] MRN: [id number], DOB/AGE: 1920-06-14   Admit date: 03/31/2015   Primary Physician: [name], MD Primary Cardiologist: Dr Martinique  HPI: 82 y/o female, lives in her own home with her husband, with a history of PAF. She has been maintaining NSR on Tikosyn, she apparently was intolerant to Amiodarone. EKG in 2013 showed her to be in NSR and Dr Doug Sou exam during an OV in 2016 showed her to be in NSR. Today she was sitting and told her husband she was going to pass out. He helped her to the floor. EMS was called and when she arrived in the ED she was in AF with RVR. This has improved with IV Diltiazem. The pt is awake and alert. She cannot tell she is in AF- "I used to be able to tell". UA in ED suggests UTI. She denies any recent illness or fever. She denies any dysuria. She has been complaint with her medications, her INR is therapeutic.    Problem List: Past Medical History  Diagnosis Date  . Atrial fibrillation ([place])   . Anxiety   . Hypertension   . Cerebrovascular accident ([place])   . Myocardial infarction ([place])   . Diverticula of colon   . Malignant neoplasm of breast [facility])     personal history of  . Anemia   . H/O: hysterectomy   . Iron deficiency   . Hyperlipidemia   . Anxiety     Past Surgical History  Procedure Laterality Date  . Cholecystectomy    . Mastectomy      left breast  . Eye surgery      bil-cataract  . Total knee arthroplasty       Allergies:  Allergies  Allergen Reactions  . Codeine Other (See Comments)    unknown  . Escitalopram Oxalate Other (See Comments)    Adverse reaction  . Phenytoin Sodium Extended Other (See Comments)    unknown     Home Medications Prior to Admission medications   Medication Sig Start Date End Date Taking? Authorizing Provider  ALPRAZolam [name] [name]) 0.25 MG tablet TAKE 1 TABLET BY MOUTH 3 TIMES A DAY AS NEEDED FOR ANXIETY 03/14/15  Yes [name], MD  amLODipine (NORVASC) 5 MG tablet  Take 1 tablet (5 mg total) by mouth at bedtime. 06/10/14  Yes [name], MD  KLOR-CON M20 20 MEQ tablet TAKE 1 TABLET BY MOUTH EVERY DAY 01/03/15  Yes [name], MD  losartan (COZAAR) 50 MG tablet Take 1 tablet (50 mg total) by mouth daily. 01/23/15  Yes [name], MD  Multiple Vitamins-Minerals (ICAPS PO) Take 1 capsule by mouth at bedtime.   Yes Historical Provider, MD  multivitamin-iron-minerals-folic acid (CENTRUM) chewable tablet Chew 1 tablet by mouth daily.     Yes Historical Provider, MD  OLANZapine (ZYPREXA) 5 MG tablet TAKE 1 TABLET BY MOUTH AT BEDTIME. 01/22/15  Yes [name], MD  SYNTHROID 50 MCG tablet TAKE 1 TABLET BY MOUTH EVERY DAY 02/19/15  Yes [name], MD  TIKOSYN 250 MCG capsule TAKE 1 CAPSULE BY MOUTH 2 TIMES DAILY. 12/19/14  Yes [name], MD  warfarin (COUMADIN) 4 MG tablet Take 4 mg by mouth See admin instructions. Only on Monday   Yes Historical Provider, MD  warfarin (COUMADIN) 5 MG tablet Take 5 mg by mouth See admin instructions. Take 5 mg every day except on Monday   Yes Historical Provider, MD  warfarin (  COUMADIN) 4 MG tablet Take 1 tablet by mouth alternating with 5 mg tablets as directed. 02/20/15   [name], MD  warfarin (COUMADIN) 5 MG tablet TAKE AS DIRECTED BY COUMADIN CLINIC 12/01/14   [name], MD     Family History  Problem Relation Age of Onset  . Coronary artery disease      family history of   . Heart disease Mother   . Hypertension Mother   . Healthy Father 43    neck fracture  . Cancer Brother     prostate     Social History   Social History  . Marital Status: Married    Spouse Name: N/A  . Number of Children: 2  . Years of Education: N/A   Occupational History  .     Social History Main Topics  . Smoking status: Never Smoker   . Smokeless tobacco: Never Used  . Alcohol Use: No  . Drug Use: No  . Sexual Activity: Not on file   Other Topics Concern  . Not on file   Social History Narrative       Review of Systems: General: negative for chills, fever, night sweats or weight changes.  Cardiovascular: negative for chest pain, dyspnea on exertion, edema, orthopnea, palpitations, paroxysmal nocturnal dyspnea or shortness of breath HEENT: negative for any visual disturbances, blindness, glaucoma Dermatological: negative for rash Respiratory: negative for cough, hemoptysis, or wheezing Urologic: negative for hematuria or dysuria Abdominal: negative for nausea, vomiting, diarrhea, bright red blood per rectum, melena, or hematemesis Neurologic: negative for visual changes, syncope, or dizziness Musculoskeletal: negative for back pain, joint pain, or swelling Psych: cooperative and appropriate All other systems reviewed and are otherwise negative except as noted above.  Physical Exam: Blood pressure 129/69, pulse 95, temperature 97.8 F (36.6 C), temperature source Oral, resp. rate 18, SpO2 93 %.  General appearance: alert, cooperative, no distress and elderly, frail Neck: no carotid bruit and no JVD Lungs: clear to auscultation bilaterally Heart: irregularly irregular rhythm Abdomen: soft, non-tender; bowel sounds normal; no masses,  no organomegaly Extremities: extremities normal, atraumatic, no cyanosis or edema Pulses: 2+ and symmetric Skin:  warm, dry Neurologic: Grossly normal resting tremor noted    Labs:   Results for orders placed or performed during the hospital encounter of 03/31/15 (from the past 24 hour(s))  CBC with Differential     Status: None   Collection Time: 03/31/15 12:10 PM  Result Value Ref Range   WBC 9.3 4.0 - 10.5 K/uL   RBC 5.06 3.87 - 5.11 MIL/uL   Hemoglobin 14.9 12.0 - 15.0 g/dL   HCT 44.6 36.0 - 46.0 %   MCV 88.1 78.0 - 100.0 fL   MCH 29.4 26.0 - 34.0 pg   MCHC 33.4 30.0 - 36.0 g/dL   RDW 14.8 11.5 - 15.5 %   Platelets 286 150 - 400 K/uL   Neutrophils Relative % 70 %   Neutro Abs 6.6 1.7 - 7.7 K/uL   Lymphocytes Relative 18 %   Lymphs  Abs 1.7 0.7 - 4.0 K/uL   Monocytes Relative 7 %   Monocytes Absolute 0.6 0.1 - 1.0 K/uL   Eosinophils Relative 4 %   Eosinophils Absolute 0.4 0.0 - 0.7 K/uL   Basophils Relative 1 %   Basophils Absolute 0.1 0.0 - 0.1 K/uL  Comprehensive metabolic panel     Status: Abnormal   Collection Time: 03/31/15 12:10 PM  Result Value Ref Range   Sodium  139 135 - 145 mmol/L   Potassium 4.6 3.5 - 5.1 mmol/L   Chloride 106 101 - 111 mmol/L   CO2 21 (L) 22 - 32 mmol/L   Glucose, Bld 90 65 - 99 mg/dL   BUN 18 6 - 20 mg/dL   Creatinine, Ser 1.06 (H) 0.44 - 1.00 mg/dL   Calcium 10.1 8.9 - 10.3 mg/dL   Total Protein 7.6 6.5 - 8.1 g/dL   Albumin 4.0 3.5 - 5.0 g/dL   AST 21 15 - 41 U/L   ALT 15 14 - 54 U/L   Alkaline Phosphatase 84 38 - 126 U/L   Total Bilirubin 0.5 0.3 - 1.2 mg/dL   GFR calc non Af Amer 44 (L) >60 mL/min   GFR calc Af Amer 51 (L) >60 mL/min   Anion gap 12 5 - 15  Brain natriuretic peptide     Status: Abnormal   Collection Time: 03/31/15 12:10 PM  Result Value Ref Range   B Natriuretic Peptide 488.0 (H) 0.0 - 100.0 pg/mL  Protime-INR     Status: Abnormal   Collection Time: 03/31/15 12:10 PM  Result Value Ref Range   Prothrombin Time 24.0 (H) 11.6 - 15.2 seconds   INR 2.17 (H) 0.00 - 1.49  Magnesium     Status: None   Collection Time: 03/31/15 12:10 PM  Result Value Ref Range   Magnesium 2.3 1.7 - 2.4 mg/dL  I-Stat Troponin, ED (not at [facility])     Status: None   Collection Time: 03/31/15 12:34 PM  Result Value Ref Range   Troponin i, poc 0.06 0.00 - 0.08 ng/mL   Comment 3          Urinalysis, Routine w reflex microscopic     Status: Abnormal   Collection Time: 03/31/15  1:12 PM  Result Value Ref Range   Color, Urine YELLOW YELLOW   APPearance CLOUDY (A) CLEAR   Specific Gravity, Urine 1.010 1.005 - 1.030   pH 7.5 5.0 - 8.0   Glucose, UA NEGATIVE NEGATIVE mg/dL   Hgb urine dipstick NEGATIVE NEGATIVE   Bilirubin Urine NEGATIVE NEGATIVE   Ketones, ur NEGATIVE NEGATIVE  mg/dL   Protein, ur NEGATIVE NEGATIVE mg/dL   Nitrite POSITIVE (A) NEGATIVE   Leukocytes, UA MODERATE (A) NEGATIVE  Urine microscopic-add on     Status: Abnormal   Collection Time: 03/31/15  1:12 PM  Result Value Ref Range   Squamous Epithelial / LPF 0-5 (A) NONE SEEN   WBC, UA TOO NUMEROUS TO COUNT 0 - 5 WBC/hpf   RBC / HPF 0-5 0 - 5 RBC/hpf   Bacteria, UA MANY (A) NONE SEEN   Casts HYALINE CASTS (A) NEGATIVE     Radiology/Studies: No results found.  EKG: In MUSE   ASSESSMENT AND PLAN:  Principal Problem:   Syncope and collapse Active Problems:   Atrial fibrillation with RVR ([facility])   Essential hypertension   H/O PAF- CHADs VASc= 6   Anticoagulant long-term use   UTI (lower urinary tract infection)   History of CVA (cerebrovascular accident)   Macular degeneration   Hyperlipidemia   Dementia   Anxiety   Tremor  PLAN:  I think UTI may have precipitated this event. She has been stable for the past few years. Admit. Continue Diltiazem. Check urine culture, will give one dose Rocephin now after culture obtained.   Signed, Erlene Quan, PA-C 03/31/2015, 2:59 PM [phone]  I have seen and examined the patient along with [facility] K, PA-C.  I have reviewed the chart, notes and new data.  I agree with PA's note.  Key new complaints: feels weak, otherwise mostly anxious (upset she missed her hair appointment!). Denies angina or dyspnea Key examination changes: now RRR, normal cardiac exam Key new findings / data: Telemetry shows smooth conversion back to NSR, without post-conversion pause. QTC appropriately moderately prolonged (on Tikosyn). UA does suggest UTI, although no complaints of fever, chills, flank pain. Some frequency.  PLAN: Admit for antibiotics. DC diltiazem. So far no evidence to suggest proarrhythmia or tachy-brady sd. Consider switching from amlodipine to a beta blocker for better rate control during AF breakthrough. Has appt for INR check on Monday.  Discussed risk that antibiotics could interfere with warfarin. She dislikes going to have her INR checked. Discussed option to switch to DOAC, which she can review further with Dr. Martinique and Tommy Medal. Possible DC in AM if OK with ambulation.  Sanda Klein, MD, [place] [phone] 03/31/2015, 4:02 PM

## 2015-04-01 LAB — BASIC METABOLIC PANEL
Anion gap: 9 (ref 5–15)
BUN: 23 mg/dL — ABNORMAL HIGH (ref 6–20)
CO2: 25 mmol/L (ref 22–32)
Calcium: 9.2 mg/dL (ref 8.9–10.3)
Chloride: 107 mmol/L (ref 101–111)
Creatinine, Ser: 1.05 mg/dL — ABNORMAL HIGH (ref 0.44–1.00)
GFR calc Af Amer: 52 mL/min — ABNORMAL LOW (ref >60)
GFR calc non Af Amer: 45 mL/min — ABNORMAL LOW (ref >60)
Glucose, Bld: 107 mg/dL — ABNORMAL HIGH (ref 65–99)
Potassium: 4.2 mmol/L (ref 3.5–5.1)
Sodium: 141 mmol/L (ref 135–145)

## 2015-04-01 LAB — CBC
HCT: 36.6 % (ref 36.0–46.0)
Hemoglobin: 12.1 g/dL (ref 12.0–15.0)
MCH: 29.4 pg (ref 26.0–34.0)
MCHC: 33.1 g/dL (ref 30.0–36.0)
MCV: 89.1 fL (ref 78.0–100.0)
Platelets: 245 10*3/uL (ref 150–400)
RBC: 4.11 MIL/uL (ref 3.87–5.11)
RDW: 14.9 % (ref 11.5–15.5)
WBC: 11.4 10*3/uL — ABNORMAL HIGH (ref 4.0–10.5)

## 2015-04-01 LAB — PROTIME-INR
INR: 2.63 — ABNORMAL HIGH (ref 0.00–1.49)
Prothrombin Time: 27.7 seconds — ABNORMAL HIGH (ref 11.6–15.2)

## 2015-04-01 MED ORDER — WARFARIN SODIUM 5 MG PO TABS
ORAL_TABLET | ORAL | Status: DC
Start: 2015-04-01 — End: 2015-06-15

## 2015-04-01 MED ORDER — CIPROFLOXACIN HCL 500 MG PO TABS
500.0000 mg | ORAL_TABLET | Freq: Two times a day (BID) | ORAL | Status: DC
  Administered 2015-04-01: 500 mg via ORAL
  Filled 2015-04-01: qty 1

## 2015-04-01 MED ORDER — ACETAMINOPHEN 325 MG PO TABS: 650.0000 mg | ORAL_TABLET | ORAL | Status: AC | PRN

## 2015-04-01 MED ORDER — CIPROFLOXACIN HCL 500 MG PO TABS: 500.0000 mg | ORAL_TABLET | Freq: Two times a day (BID) | ORAL | Status: DC

## 2015-04-01 NOTE — Discharge Summary (Signed)
Discharge Summary    Patient ID: [name] [name],  MRN: [id number], DOB/AGE: September 09, 1920 82 y.o.  Admit date: 03/31/2015 Discharge date: 04/01/2015  Primary Care Provider: [name] Primary Cardiologist: Dr Martinique  Discharge Diagnoses    Principal Problem:   Syncope and collapse Active Problems:   Atrial fibrillation with RVR ([place])   Essential hypertension   H/O PAF- CHADs VASc= 6   Anticoagulant long-term use   UTI (lower urinary tract infection)   History of CVA (cerebrovascular accident)   Macular degeneration   Hyperlipidemia   Dementia   Anxiety   Tremor   Syncope   Allergies Allergies  Allergen Reactions  . Codeine Other (See Comments)    unknown  . Escitalopram Oxalate Other (See Comments)    Adverse reaction  . Phenytoin Sodium Extended Other (See Comments)    unknown    Diagnostic Studies/Procedures    _____________   History of Present Illness     82 y/o female with history of PAF, admitted with syncope in setting of AF with RVR and suspected UTI.   Hospital Course       82 y/o female, lives in her own home with her husband, with a history of PAF. She has been maintaining NSR on Tikosyn, she apparently was intolerant to Amiodarone. EKG in 2013 showed her to be in NSR and Dr Doug Sou exam during an OV in 2016 showed her to be in NSR. Today she was sitting and told her husband she was going to pass out. He helped her to the floor. EMS was called and when she arrived in the ED she was in AF with RVR. This has improved with IV Diltiazem. The pt is awake and alert. She cannot tell she is in AF- "I used to be able to tell". UA in ED suggests UTI. She denies any recent illness or fever. She denies any dysuria. She has been complaint with her medications, her INR was therapeutic. She actually converted to NSR spontaneously in the ED. She was admitted and given a dose of Rocephin, culture is still pending. She was see the morning of the 17th and felt to be  stable for discharge. We'll complete 3 days total of antibiotics. She has a follow up Monday and her INR and culture can be followed up then. Dr Sallyanne Kuster had suggested switching Norvasc to Diltiazem or possible low dose beta blocker, this will be deferred to Dr Martinique.  _____________  Discharge Vitals Blood pressure 92/77, pulse 89, temperature 98.4 F (36.9 C), temperature source Oral, resp. rate 18, height 5\' 3"  (1.6 m), weight 138 lb 10.7 oz (62.9 kg), SpO2 100 %.  Filed Weights   03/31/15 1758  Weight: 138 lb 10.7 oz (62.9 kg)    Labs & Radiologic Studies     CBC  Recent Labs  03/31/15 1210 04/01/15 0504  WBC 9.3 11.4*  NEUTROABS 6.6  --   HGB 14.9 12.1  HCT 44.6 36.6  MCV 88.1 89.1  PLT 286 [id number]   Basic Metabolic Panel  Recent Labs  03/31/15 1210 04/01/15 0504  NA 139 141  K 4.6 4.2  CL 106 107  CO2 21* 25  GLUCOSE 90 107*  BUN 18 23*  CREATININE 1.06* 1.05*  CALCIUM 10.1 9.2  MG 2.3  --    Liver Function Tests  Recent Labs  03/31/15 1210  AST 21  ALT 15  ALKPHOS 84  BILITOT 0.5  PROT 7.6  ALBUMIN 4.0  No results for input(s): LIPASE, AMYLASE in the last 72 hours. Cardiac Enzymes No results for input(s): CKTOTAL, CKMB, CKMBINDEX, TROPONINI in the last 72 hours. BNP Invalid input(s): POCBNP D-Dimer No results for input(s): DDIMER in the last 72 hours. Hemoglobin A1C No results for input(s): HGBA1C in the last 72 hours. Fasting Lipid Panel No results for input(s): CHOL, HDL, LDLCALC, TRIG, CHOLHDL, LDLDIRECT in the last 72 hours. Thyroid Function Tests No results for input(s): TSH, T4TOTAL, T3FREE, THYROIDAB in the last 72 hours.  Invalid input(s): FREET3  No results found.  Disposition   Pt is being discharged home today in good condition.  Follow-up Plans & Appointments    Follow-up Information    Follow up with Peter Martinique, MD On 04/03/2015.   Specialty:  Cardiology   Why:  10:30   Contact information:   [address] [place] [address] [phone]        Discharge Medications   Current Discharge Medication List    START taking these medications   Details  acetaminophen (TYLENOL) 325 MG tablet Take 2 tablets (650 mg total) by mouth every 4 (four) hours as needed for headache or mild pain.    ciprofloxacin (CIPRO) 500 MG tablet Take 1 tablet (500 mg total) by mouth 2 (two) times daily. Qty: 1 tablet, Refills: 0      CONTINUE these medications which have CHANGED   Details  warfarin (COUMADIN) 5 MG tablet TAKE AS DIRECTED BY COUMADIN CLINIC Qty: 90 tablet, Refills: 1      CONTINUE these medications which have NOT CHANGED   Details  ALPRAZolam (XANAX) 0.25 MG tablet TAKE 1 TABLET BY MOUTH 3 TIMES A DAY AS NEEDED FOR ANXIETY Qty: 90 tablet, Refills: 1    amLODipine (NORVASC) 5 MG tablet Take 1 tablet (5 mg total) by mouth at bedtime. Qty: 90 tablet, Refills: 3    KLOR-CON M20 20 MEQ tablet TAKE 1 TABLET BY MOUTH EVERY DAY Qty: 30 tablet, Refills: 5    losartan (COZAAR) 50 MG tablet Take 1 tablet (50 mg total) by mouth daily. Qty: 90 tablet, Refills: 0    multivitamin-iron-minerals-folic acid (CENTRUM) chewable tablet Chew 1 tablet by mouth daily.      OLANZapine (ZYPREXA) 5 MG tablet TAKE 1 TABLET BY MOUTH AT BEDTIME. Qty: 30 tablet, Refills: 2    SYNTHROID 50 MCG tablet TAKE 1 TABLET BY MOUTH EVERY DAY Qty: 30 tablet, Refills: 2    TIKOSYN 250 MCG capsule TAKE 1 CAPSULE BY MOUTH 2 TIMES DAILY. Qty: 60 capsule, Refills: 4      STOP taking these medications     Multiple Vitamins-Minerals (ICAPS PO)          Outstanding Labs/Studies   Urine culture pending Pt needs an INR Monday  Duration of Discharge Encounter   Greater than 30 minutes including physician time.  Angelena Form K PA 04/01/2015, 10:47 AM   Patient seen and examined.  Plan as discussed in my rounding note for today and outlined above. Jeneen Rinks [facility]  04/01/2015  11:40 AM

## 2015-04-01 NOTE — Progress Notes (Signed)
SUBJECTIVE:  No pain or SOB   PHYSICAL EXAM Filed Vitals:   03/31/15 1758 03/31/15 2115 04/01/15 0354 04/01/15 0357  BP: 163/62 120/47 89/71 92/77   Pulse: 62 113 89   Temp: 97.8 F (36.6 C) 98 F (36.7 C) 98.4 F (36.9 C)   TempSrc: Oral Oral Oral   Resp: 18 18 18    Height: 5\' 3"  (1.6 m)     Weight: 138 lb 10.7 oz (62.9 kg)     SpO2: 95% 100% 100%    General:  No distress Lungs:  Clear Heart:  RRR Abdomen:  Positive bowel sounds, no rebound no guarding Extremities:  No edema  LABS: Lab Results  Component Value Date   TROPONINI <0.30 11/29/2011   Results for orders placed or performed during the hospital encounter of 03/31/15 (from the past 24 hour(s))  CBC with Differential     Status: None   Collection Time: 03/31/15 12:10 PM  Result Value Ref Range   WBC 9.3 4.0 - 10.5 K/uL   RBC 5.06 3.87 - 5.11 MIL/uL   Hemoglobin 14.9 12.0 - 15.0 g/dL   HCT 44.6 36.0 - 46.0 %   MCV 88.1 78.0 - 100.0 fL   MCH 29.4 26.0 - 34.0 pg   MCHC 33.4 30.0 - 36.0 g/dL   RDW 14.8 11.5 - 15.5 %   Platelets 286 150 - 400 K/uL   Neutrophils Relative % 70 %   Neutro Abs 6.6 1.7 - 7.7 K/uL   Lymphocytes Relative 18 %   Lymphs Abs 1.7 0.7 - 4.0 K/uL   Monocytes Relative 7 %   Monocytes Absolute 0.6 0.1 - 1.0 K/uL   Eosinophils Relative 4 %   Eosinophils Absolute 0.4 0.0 - 0.7 K/uL   Basophils Relative 1 %   Basophils Absolute 0.1 0.0 - 0.1 K/uL  Comprehensive metabolic panel     Status: Abnormal   Collection Time: 03/31/15 12:10 PM  Result Value Ref Range   Sodium 139 135 - 145 mmol/L   Potassium 4.6 3.5 - 5.1 mmol/L   Chloride 106 101 - 111 mmol/L   CO2 21 (L) 22 - 32 mmol/L   Glucose, Bld 90 65 - 99 mg/dL   BUN 18 6 - 20 mg/dL   Creatinine, Ser 1.06 (H) 0.44 - 1.00 mg/dL   Calcium 10.1 8.9 - 10.3 mg/dL   Total Protein 7.6 6.5 - 8.1 g/dL   Albumin 4.0 3.5 - 5.0 g/dL   AST 21 15 - 41 U/L   ALT 15 14 - 54 U/L   Alkaline Phosphatase 84 38 - 126 U/L   Total Bilirubin 0.5 0.3 -  1.2 mg/dL   GFR calc non Af Amer 44 (L) >60 mL/min   GFR calc Af Amer 51 (L) >60 mL/min   Anion gap 12 5 - 15  Brain natriuretic peptide     Status: Abnormal   Collection Time: 03/31/15 12:10 PM  Result Value Ref Range   B Natriuretic Peptide 488.0 (H) 0.0 - 100.0 pg/mL  Protime-INR     Status: Abnormal   Collection Time: 03/31/15 12:10 PM  Result Value Ref Range   Prothrombin Time 24.0 (H) 11.6 - 15.2 seconds   INR 2.17 (H) 0.00 - 1.49  Magnesium     Status: None   Collection Time: 03/31/15 12:10 PM  Result Value Ref Range   Magnesium 2.3 1.7 - 2.4 mg/dL  I-Stat Troponin, ED (not at [facility])     Status: None  Collection Time: 03/31/15 12:34 PM  Result Value Ref Range   Troponin i, poc 0.06 0.00 - 0.08 ng/mL   Comment 3          Urinalysis, Routine w reflex microscopic     Status: Abnormal   Collection Time: 03/31/15  1:12 PM  Result Value Ref Range   Color, Urine YELLOW YELLOW   APPearance CLOUDY (A) CLEAR   Specific Gravity, Urine 1.010 1.005 - 1.030   pH 7.5 5.0 - 8.0   Glucose, UA NEGATIVE NEGATIVE mg/dL   Hgb urine dipstick NEGATIVE NEGATIVE   Bilirubin Urine NEGATIVE NEGATIVE   Ketones, ur NEGATIVE NEGATIVE mg/dL   Protein, ur NEGATIVE NEGATIVE mg/dL   Nitrite POSITIVE (A) NEGATIVE   Leukocytes, UA MODERATE (A) NEGATIVE  Urine microscopic-add on     Status: Abnormal   Collection Time: 03/31/15  1:12 PM  Result Value Ref Range   Squamous Epithelial / LPF 0-5 (A) NONE SEEN   WBC, UA TOO NUMEROUS TO COUNT 0 - 5 WBC/hpf   RBC / HPF 0-5 0 - 5 RBC/hpf   Bacteria, UA MANY (A) NONE SEEN   Casts HYALINE CASTS (A) NEGATIVE  Basic metabolic panel     Status: Abnormal   Collection Time: 04/01/15  5:04 AM  Result Value Ref Range   Sodium 141 135 - 145 mmol/L   Potassium 4.2 3.5 - 5.1 mmol/L   Chloride 107 101 - 111 mmol/L   CO2 25 22 - 32 mmol/L   Glucose, Bld 107 (H) 65 - 99 mg/dL   BUN 23 (H) 6 - 20 mg/dL   Creatinine, Ser 1.05 (H) 0.44 - 1.00 mg/dL   Calcium 9.2 8.9 -  10.3 mg/dL   GFR calc non Af Amer 45 (L) >60 mL/min   GFR calc Af Amer 52 (L) >60 mL/min   Anion gap 9 5 - 15  CBC     Status: Abnormal   Collection Time: 04/01/15  5:04 AM  Result Value Ref Range   WBC 11.4 (H) 4.0 - 10.5 K/uL   RBC 4.11 3.87 - 5.11 MIL/uL   Hemoglobin 12.1 12.0 - 15.0 g/dL   HCT 36.6 36.0 - 46.0 %   MCV 89.1 78.0 - 100.0 fL   MCH 29.4 26.0 - 34.0 pg   MCHC 33.1 30.0 - 36.0 g/dL   RDW 14.9 11.5 - 15.5 %   Platelets 245 150 - 400 K/uL  Protime-INR     Status: Abnormal   Collection Time: 04/01/15  5:04 AM  Result Value Ref Range   Prothrombin Time 27.7 (H) 11.6 - 15.2 seconds   INR 2.63 (H) 0.00 - 1.49    Intake/Output Summary (Last 24 hours) at 04/01/15 0941 Last data filed at 03/31/15 1837  Gross per 24 hour  Intake    240 ml  Output      0 ml  Net    240 ml      ASSESSMENT AND PLAN:  ATRIAL FIB WITH RVR:  Converted back to NSR.  I will leave her on the same meds.  She is follow up with Dr. Martinique on Monday and he can discuss a change of amlodipine if he wishes.    Tele reviewed.   UTI:  I would suggest that she should have 3 days total of antibiotics.   She could go home on Cipro for a day.    Jeneen Rinks [facility] 04/01/2015 9:41 AM

## 2015-04-02 LAB — URINE CULTURE

## 2015-04-03 ENCOUNTER — Ambulatory Visit (INDEPENDENT_AMBULATORY_CARE_PROVIDER_SITE_OTHER): Payer: Medicare Other | Admitting: Pharmacist Clinician (PhC)/ Clinical Pharmacy Specialist

## 2015-04-03 ENCOUNTER — Encounter: Payer: Self-pay | Admitting: Cardiology

## 2015-04-03 ENCOUNTER — Ambulatory Visit (INDEPENDENT_AMBULATORY_CARE_PROVIDER_SITE_OTHER): Payer: Medicare Other | Admitting: Cardiology

## 2015-04-03 VITALS — BP 142/62 | HR 64 | Ht 63.0 in | Wt 139.4 lb

## 2015-04-03 DIAGNOSIS — N390 Urinary tract infection, site not specified: Secondary | ICD-10-CM

## 2015-04-03 DIAGNOSIS — I480 Paroxysmal atrial fibrillation: Secondary | ICD-10-CM

## 2015-04-03 DIAGNOSIS — R55 Syncope and collapse: Secondary | ICD-10-CM | POA: Diagnosis not present

## 2015-04-03 DIAGNOSIS — E785 Hyperlipidemia, unspecified: Secondary | ICD-10-CM

## 2015-04-03 DIAGNOSIS — Z7901 Long term (current) use of anticoagulants: Secondary | ICD-10-CM | POA: Diagnosis not present

## 2015-04-03 DIAGNOSIS — I4891 Unspecified atrial fibrillation: Secondary | ICD-10-CM | POA: Diagnosis not present

## 2015-04-03 LAB — URINALYSIS
BILIRUBIN URINE: NEGATIVE
Glucose, UA: NEGATIVE
Hgb urine dipstick: NEGATIVE
Ketones, ur: NEGATIVE
Nitrite: NEGATIVE
PROTEIN: NEGATIVE
Specific Gravity, Urine: 1.013 (ref 1.001–1.035)
pH: 6 (ref 5.0–8.0)

## 2015-04-03 LAB — POCT INR: INR: 2.9

## 2015-04-03 NOTE — Progress Notes (Signed)
[name] [name] Date of Birth: [birth year] Medical Record [id number]  History of Present Illness: [name] [name] is seen for follow up atrial fibrillation. She has a history of atrial fib. Has been on Tikosyn and remains on her coumadin. She is intolerant of amiodarone. Other issues include anxiety, HTN, past CVA, HLD, iron deficiency anemia and prior cardiomyopathy. She has a chronic tremor.  She was just admitted overnight 2/17-2/18 for a syncopal episode. She was in Afib with RVR. BP was low. She converted spontaneously. This was felt to be triggered by a UTI. Otherwise she has very infrequent episodes of Afib.    Current Outpatient Prescriptions on File Prior to Visit  Medication Sig Dispense Refill  . acetaminophen (TYLENOL) 325 MG tablet Take 2 tablets (650 mg total) by mouth every 4 (four) hours as needed for headache or mild pain.    Marland Kitchen ALPRAZolam (XANAX) 0.25 MG tablet TAKE 1 TABLET BY MOUTH 3 TIMES A DAY AS NEEDED FOR ANXIETY 90 tablet 1  . amLODipine (NORVASC) 5 MG tablet Take 1 tablet (5 mg total) by mouth at bedtime. 90 tablet 3  . ciprofloxacin (CIPRO) 500 MG tablet Take 1 tablet (500 mg total) by mouth 2 (two) times daily. 1 tablet 0  . KLOR-CON M20 20 MEQ tablet TAKE 1 TABLET BY MOUTH EVERY DAY 30 tablet 5  . losartan (COZAAR) 50 MG tablet Take 1 tablet (50 mg total) by mouth daily. 90 tablet 0  . multivitamin-iron-minerals-folic acid (CENTRUM) chewable tablet Chew 1 tablet by mouth daily.      Marland Kitchen OLANZapine (ZYPREXA) 5 MG tablet TAKE 1 TABLET BY MOUTH AT BEDTIME. 30 tablet 2  . SYNTHROID 50 MCG tablet TAKE 1 TABLET BY MOUTH EVERY DAY 30 tablet 2  . TIKOSYN 250 MCG capsule TAKE 1 CAPSULE BY MOUTH 2 TIMES DAILY. 60 capsule 4  . warfarin (COUMADIN) 5 MG tablet TAKE AS DIRECTED BY COUMADIN CLINIC 90 tablet 1   No current facility-administered medications on file prior to visit.    Allergies  Allergen Reactions  . Codeine Other (See Comments)    unknown  . Escitalopram Oxalate Other  (See Comments)    Adverse reaction  . Phenytoin Sodium Extended Other (See Comments)    unknown    Past Medical History  Diagnosis Date  . Atrial fibrillation ([place])   . Anxiety   . Hypertension   . Cerebrovascular accident ([place])   . Myocardial infarction ([place])   . Diverticula of colon   . Malignant neoplasm of breast [facility])     personal history of  . Anemia   . H/O: hysterectomy   . Iron deficiency   . Hyperlipidemia   . Anxiety     Past Surgical History  Procedure Laterality Date  . Cholecystectomy    . Mastectomy      left breast  . Eye surgery      bil-cataract  . Total knee arthroplasty      History  Smoking status  . Never Smoker   Smokeless tobacco  . Never Used    History  Alcohol Use No    Family History  Problem Relation Age of Onset  . Coronary artery disease      family history of   . Heart disease Mother   . Hypertension Mother   . Healthy Father 43    neck fracture  . Cancer Brother     prostate    Review of Systems: The review of systems is per the HPI.  All other systems were reviewed and are negative.  Physical Exam: BP 142/62 mmHg  Pulse 64  Ht 5\' 3"  (1.6 m)  Wt 63.231 kg (139 lb 6.4 oz)  BMI 24.70 kg/m2 Patient is very pleasant and in no acute distress. She has a significant tremor. Skin is warm and dry. Color is normal.  HEENT is unremarkable. Normocephalic/atraumatic. PERRL. Sclera are nonicteric. Neck is supple. No masses. No JVD. Lungs are clear. Cardiac exam shows a regular rate and rhythm. Abdomen is soft. Extremities are without edema. Gait and ROM are intact. No gross neurologic deficits noted.   LABORATORY DATA:  Lab Results  Component Value Date   WBC 11.4* 04/01/2015   HGB 12.1 04/01/2015   HCT 36.6 04/01/2015   PLT 245 04/01/2015   GLUCOSE 107* 04/01/2015   CHOL 222* 10/29/2013   TRIG 187* 10/29/2013   HDL 59 10/29/2013   LDLCALC 126* 10/29/2013   ALT 15 03/31/2015   AST 21 03/31/2015   NA 141 04/01/2015    K 4.2 04/01/2015   CL 107 04/01/2015   CREATININE 1.05* 04/01/2015   BUN 23* 04/01/2015   CO2 25 04/01/2015   TSH 3.073 10/29/2013   INR 2.9 04/03/2015     INR is 2.9  Assessment / Plan:  1. PAF - remains in sinus today. Recent episode possibly triggered by UTI. Continue Tikosyn.  Continue Coumadin for anticoagulation. If she has more frequent breakthrough would consider switching amlodipine to diltiazem or a beta blocker.   2. Hypertension, controlled.  3. History of CVA.  4. Chronic anticoagulation- therapeutic.  5. UTI. Will repeat UA and culture post antibiotics.

## 2015-04-03 NOTE — Patient Instructions (Signed)
We will repeat a urinalysis and culture today  Continue your other therapy

## 2015-04-03 NOTE — Progress Notes (Signed)
Utilization review completed- completed post discharge 

## 2015-04-05 LAB — URINE CULTURE
COLONY COUNT: NO GROWTH
ORGANISM ID, BACTERIA: NO GROWTH

## 2015-04-07 ENCOUNTER — Telehealth: Payer: Self-pay | Admitting: Cardiology

## 2015-04-07 NOTE — Telephone Encounter (Signed)
Would like her lab results from 04-03-15 please.

## 2015-04-07 NOTE — Telephone Encounter (Signed)
Returned call to patient's daughter n law Manus Gunning advised u/a and urine culture normal.

## 2015-04-16 ENCOUNTER — Other Ambulatory Visit: Payer: Self-pay | Admitting: Internal Medicine

## 2015-04-18 ENCOUNTER — Telehealth: Payer: Self-pay | Admitting: Internal Medicine

## 2015-04-18 NOTE — Telephone Encounter (Signed)
See tomorrow

## 2015-04-18 NOTE — Telephone Encounter (Signed)
Spoke with Manus Gunning, daughter-in-law and provided appointment for Wednesday, 3/8 @ 11:15.  She confirmed.

## 2015-04-18 NOTE — Telephone Encounter (Signed)
Daughter-in-law is calling; states that patient is coughing with congestion and she believes she hears her wheezing.  She is afraid that it may go into her lungs.  I had given the husband an appointment for Monday, 3/13 when he called yesterday.  Manus Gunning is calling today stating that this has been a problem now, especially when she's eating she coughs a lot.  They feel she needs to be seen sooner.  I told her I would send a message to you and see if you could see her prior to Monday.  Please advise.

## 2015-04-19 ENCOUNTER — Ambulatory Visit
Admission: RE | Admit: 2015-04-19 | Discharge: 2015-04-19 | Disposition: A | Discharge status: Home / Self Care | Payer: Medicare Other | Source: Ambulatory Visit | Attending: Internal Medicine | Admitting: Internal Medicine

## 2015-04-19 ENCOUNTER — Ambulatory Visit (INDEPENDENT_AMBULATORY_CARE_PROVIDER_SITE_OTHER): Payer: Medicare Other | Admitting: Internal Medicine

## 2015-04-19 ENCOUNTER — Encounter: Payer: Self-pay | Admitting: Internal Medicine

## 2015-04-19 VITALS — BP 96/57 | HR 92 | Temp 99.6°F | Resp 22 | Ht 63.0 in | Wt 134.0 lb

## 2015-04-19 DIAGNOSIS — J069 Acute upper respiratory infection, unspecified: Secondary | ICD-10-CM

## 2015-04-19 DIAGNOSIS — Z8679 Personal history of other diseases of the circulatory system: Secondary | ICD-10-CM | POA: Diagnosis not present

## 2015-04-19 DIAGNOSIS — F411 Generalized anxiety disorder: Secondary | ICD-10-CM | POA: Diagnosis not present

## 2015-04-19 MED ORDER — AMOXICILLIN 250 MG PO CHEW: 250.0000 mg | CHEWABLE_TABLET | Freq: Three times a day (TID) | ORAL | Status: DC

## 2015-04-19 MED ORDER — CEFTRIAXONE SODIUM 1 G IJ SOLR
1.0000 g | INTRAMUSCULAR | Status: AC
  Administered 2015-04-19: 1 g via INTRAMUSCULAR

## 2015-04-19 NOTE — Progress Notes (Signed)
   Subjective:    Patient ID: [name] [name], female    DOB: 1920/11/11, 82 y.o.   MRN: [id number]  HPI   Hospitalized February 17 with bout of atrial fibrillation. No chest x-ray was done at that admission. Was thought to have a urinary tract infection but culture had multiple species although urinalysis was abnormal. Recent urine culture showed no growth. Has come down with URI symptoms over the past 5 days. Has slight cough that is repetitive. Low-grade fever is present. Appetite fair. Accompanied by daughter-in-law today. Continues to live at home with her husband. She has a history of extreme anxiety and likely has dementia. History of breast cancer. History of essential hypertension.  She's not been seen here since 2015. Usually sees Dr. Peter Martinique, Cardiologist. We do prescribe Zyprexa for her at bedtime. History of agoraphobia.    Review of Systems     Objective:   Physical Exam  Skin warm and dry.  Recognizes me. Able to carry on some conversation. Neck is supple without adenopathy. Chest: occasional scattered wheeze on inspiration but no rales. Cardiac exam: regular rate and rhythm. Extremities without edema. Has slight repetitive hacking cough.      Assessment & Plan:  Acute URI  History of atrial fibrillation  Dementia  Essential hypertension  History of anxiety  Plan: 1 g IM Rocephin. Amoxicillin 250 mg 3 times daily for 10 days. Have chest x-ray today. May take Delsym as needed for cough.  Addendum: Chest x-ray shows no evidence of congestive heart failure or pneumonia.

## 2015-04-19 NOTE — Patient Instructions (Addendum)
Rocephin 1 g IM. Amoxicillin 250 mg 3 times daily for 10 days. Chest x-ray shows no heart failure or pneumonia

## 2015-04-21 ENCOUNTER — Other Ambulatory Visit: Payer: Self-pay | Admitting: Cardiology

## 2015-04-24 ENCOUNTER — Ambulatory Visit (INDEPENDENT_AMBULATORY_CARE_PROVIDER_SITE_OTHER): Payer: Medicare Other | Admitting: Pharmacist Clinician (PhC)/ Clinical Pharmacy Specialist

## 2015-04-24 ENCOUNTER — Ambulatory Visit: Payer: Self-pay | Admitting: Internal Medicine

## 2015-04-24 DIAGNOSIS — I4891 Unspecified atrial fibrillation: Secondary | ICD-10-CM

## 2015-04-24 DIAGNOSIS — I480 Paroxysmal atrial fibrillation: Secondary | ICD-10-CM

## 2015-04-24 LAB — POCT INR: INR: 3.8

## 2015-05-09 ENCOUNTER — Ambulatory Visit (INDEPENDENT_AMBULATORY_CARE_PROVIDER_SITE_OTHER): Payer: Medicare Other | Admitting: Pharmacist Clinician (PhC)/ Clinical Pharmacy Specialist

## 2015-05-09 DIAGNOSIS — I480 Paroxysmal atrial fibrillation: Secondary | ICD-10-CM

## 2015-05-09 DIAGNOSIS — I4891 Unspecified atrial fibrillation: Secondary | ICD-10-CM

## 2015-05-09 LAB — POCT INR: INR: 2.3

## 2015-05-15 ENCOUNTER — Other Ambulatory Visit: Payer: Self-pay | Admitting: Cardiology

## 2015-05-20 ENCOUNTER — Other Ambulatory Visit: Payer: Self-pay | Admitting: Internal Medicine

## 2015-05-20 ENCOUNTER — Other Ambulatory Visit: Payer: Self-pay | Admitting: Cardiology

## 2015-05-22 NOTE — Telephone Encounter (Signed)
She was here recently but did not have TSH. Have her come for OV and TSH

## 2015-05-22 NOTE — Telephone Encounter (Signed)
Rx request sent to pharmacy.  

## 2015-05-22 NOTE — Telephone Encounter (Signed)
Attempted to contact patient; no answer, unable to leave vcm will try again at a later time

## 2015-05-30 ENCOUNTER — Ambulatory Visit (INDEPENDENT_AMBULATORY_CARE_PROVIDER_SITE_OTHER): Payer: Medicare Other | Admitting: Pharmacist Clinician (PhC)/ Clinical Pharmacy Specialist

## 2015-05-30 DIAGNOSIS — I4891 Unspecified atrial fibrillation: Secondary | ICD-10-CM

## 2015-05-30 DIAGNOSIS — I480 Paroxysmal atrial fibrillation: Secondary | ICD-10-CM

## 2015-05-30 LAB — POCT INR: INR: 3.5

## 2015-06-08 ENCOUNTER — Telehealth: Payer: Self-pay | Admitting: Cardiology

## 2015-06-08 NOTE — Telephone Encounter (Signed)
Returned call to patient's daughter n law Manus Gunning.She stated she wanted to know if home health could draw patient's INR's.Stated it is becoming more difficult to bring her out.Advised I will send message to our pharmacist Erasmo Downer.

## 2015-06-08 NOTE — Telephone Encounter (Signed)
Spoke with daughter in [employer].  They would prefer to get Ms. Mellone onto a DOAC, as it is becoming more difficult for her husband (he's 95) to get her to appointments.  Reviewed information on Xarelto and Eliquis with her.  She and her husband will talk to Mr. Abbe Amsterdam lips and see if they can convince him to switch medications.  She has been on warfarin for many years and he is hesitant to make any changes.     Xarelto dose would be 15 mg qd - as patient has CrCl of 34.   Eliquis would be 5 mg bid because weight is 61 kg and SCr 1.06.  However she is a frail 31 and I would seriously consider just going with 2.5 mg bid  Reviewed with Dr. Martinique, will do either Xarelto 15 or Eliquis 2.5, whichever the family would prefer

## 2015-06-08 NOTE — Telephone Encounter (Signed)
Pt's dtr n law calling re pt's coumadin checks , pt becoming very hard to transport and wants to know if they are other options to check it?? pls call

## 2015-06-12 NOTE — Telephone Encounter (Signed)
Patient husband willing to switch to DOAC, she has appt with [place] on Thursday, will make change at that time.

## 2015-06-15 ENCOUNTER — Ambulatory Visit (INDEPENDENT_AMBULATORY_CARE_PROVIDER_SITE_OTHER): Payer: Medicare Other | Admitting: Pharmacist Clinician (PhC)/ Clinical Pharmacy Specialist

## 2015-06-15 DIAGNOSIS — I480 Paroxysmal atrial fibrillation: Secondary | ICD-10-CM

## 2015-06-15 DIAGNOSIS — I4891 Unspecified atrial fibrillation: Secondary | ICD-10-CM

## 2015-06-15 LAB — POCT INR: INR: 2.4

## 2015-06-15 MED ORDER — APIXABAN 2.5 MG PO TABS: 2.5000 mg | ORAL_TABLET | Freq: Two times a day (BID) | ORAL | Status: DC

## 2015-06-17 ENCOUNTER — Other Ambulatory Visit: Payer: Self-pay | Admitting: Internal Medicine

## 2015-06-17 ENCOUNTER — Other Ambulatory Visit: Payer: Self-pay | Admitting: Cardiology

## 2015-06-21 MED ORDER — ALPRAZOLAM 0.25 MG PO TABS: ORAL_TABLET | ORAL | Status: DC

## 2015-06-21 NOTE — Addendum Note (Signed)
Addended by: Kathyrn Lass on: 06/21/2015 03:26 PM   Modules accepted: Orders

## 2015-06-26 ENCOUNTER — Other Ambulatory Visit: Payer: Self-pay | Admitting: Cardiology

## 2015-06-26 MED ORDER — APIXABAN 2.5 MG PO TABS: 2.5000 mg | ORAL_TABLET | Freq: Two times a day (BID) | ORAL | Status: DC

## 2015-06-26 NOTE — Telephone Encounter (Signed)
Rx request sent to pharmacy.  

## 2015-07-22 ENCOUNTER — Other Ambulatory Visit: Payer: Self-pay | Admitting: Internal Medicine

## 2015-07-29 ENCOUNTER — Other Ambulatory Visit: Payer: Self-pay | Admitting: Cardiology

## 2015-07-31 NOTE — Telephone Encounter (Signed)
Rx(s) sent to pharmacy electronically.  

## 2015-08-19 ENCOUNTER — Other Ambulatory Visit: Payer: Self-pay | Admitting: Cardiology

## 2015-09-07 ENCOUNTER — Other Ambulatory Visit: Payer: Self-pay

## 2015-09-07 MED ORDER — SYNTHROID 50 MCG PO TABS: 50.0000 ug | ORAL_TABLET | Freq: Every day | ORAL | 0 refills | Status: DC

## 2015-09-09 ENCOUNTER — Other Ambulatory Visit: Payer: Self-pay | Admitting: Cardiology

## 2015-09-11 NOTE — Telephone Encounter (Signed)
Alprazolam refill phoned in to pharmacy.

## 2015-09-18 ENCOUNTER — Encounter: Payer: Self-pay | Admitting: *Deleted

## 2015-10-02 ENCOUNTER — Ambulatory Visit (INDEPENDENT_AMBULATORY_CARE_PROVIDER_SITE_OTHER): Payer: Medicare Other | Admitting: Cardiology

## 2015-10-02 ENCOUNTER — Encounter: Payer: Self-pay | Admitting: Cardiology

## 2015-10-02 VITALS — BP 162/84 | HR 81 | Ht 63.0 in | Wt 132.8 lb

## 2015-10-02 DIAGNOSIS — I480 Paroxysmal atrial fibrillation: Secondary | ICD-10-CM | POA: Diagnosis not present

## 2015-10-02 DIAGNOSIS — E785 Hyperlipidemia, unspecified: Secondary | ICD-10-CM

## 2015-10-02 MED ORDER — APIXABAN 2.5 MG PO TABS: 2.5000 mg | ORAL_TABLET | Freq: Two times a day (BID) | ORAL | 3 refills | Status: DC

## 2015-10-02 NOTE — Patient Instructions (Signed)
Continue your current therapy  I will see you in 6 months.   

## 2015-10-02 NOTE — Progress Notes (Signed)
[name] [name] Date of Birth: Jul 28, 1920 Medical Record [id number]  History of Present Illness: [name] [name] is seen for follow up atrial fibrillation. She has a history of atrial fib. Has been on Tikosyn and remains on her coumadin. She is intolerant of amiodarone. Other issues include anxiety, HTN, past CVA, HLD, iron deficiency anemia and prior cardiomyopathy. She has a chronic tremor.  She was admitted overnight 2/17-2/18/17 for a syncopal episode. She was in Afib with RVR. BP was low. She converted spontaneously. This was felt to be triggered by a UTI. Otherwise she has very infrequent episodes of Afib. She is currently asymptomatic. No chest pain, dyspnea or palpitations.      Medication List       Accurate as of 10/02/15  2:00 PM. Always use your most recent med list.          acetaminophen 325 MG tablet Commonly known as:  TYLENOL Take 2 tablets (650 mg total) by mouth every 4 (four) hours as needed for headache or mild pain.   ALPRAZolam 0.25 MG tablet Commonly known as:  XANAX 1 BY MOUTH 3 TIMES A DAY AS NEEDED   amLODipine 5 MG tablet Commonly known as:  NORVASC TAKE 1 TABLET (5 MG TOTAL) BY MOUTH AT BEDTIME.   amoxicillin 250 MG chewable tablet Commonly known as:  AMOXIL Chew 1 tablet (250 mg total) by mouth 3 (three) times daily.   apixaban 2.5 MG Tabs tablet Commonly known as:  ELIQUIS Take 1 tablet (2.5 mg total) by mouth 2 (two) times daily.   KLOR-CON M20 20 MEQ tablet Generic drug:  potassium chloride SA TAKE 1 TABLET BY MOUTH EVERY DAY   losartan 50 MG tablet Commonly known as:  COZAAR TAKE 1 TABLET (50 MG TOTAL) BY MOUTH DAILY.   multivitamin-iron-minerals-folic acid chewable tablet Chew 1 tablet by mouth daily.   OLANZapine 5 MG tablet Commonly known as:  ZYPREXA TAKE 1 TABLET BY MOUTH AT BEDTIME.   SYNTHROID 50 MCG tablet Generic drug:  levothyroxine Take 1 tablet (50 mcg total) by mouth daily.   TIKOSYN 250 MCG capsule Generic drug:   dofetilide TAKE 1 CAPSULE BY MOUTH 2 TIMES DAILY. (DAW2)        Allergies  Allergen Reactions  . Escitalopram Oxalate Other (See Comments)    Adverse reaction  . Codeine Other (See Comments)    unknown  . Phenytoin Sodium Extended Other (See Comments)    unknown    Past Medical History:  Diagnosis Date  . Anemia   . Anxiety   . Anxiety   . Atrial fibrillation ([place])   . Cerebrovascular accident ([place])   . Diverticula of colon   . H/O: hysterectomy   . Hyperlipidemia   . Hypertension   . Iron deficiency   . Malignant neoplasm of breast [facility])    personal history of  . Myocardial infarction [facility])     Past Surgical History:  Procedure Laterality Date  . CHOLECYSTECTOMY    . EYE SURGERY     bil-cataract  . MASTECTOMY     left breast  . TOTAL KNEE ARTHROPLASTY      History  Smoking Status  . Never Smoker  Smokeless Tobacco  . Never Used    History  Alcohol Use No    Family History  Problem Relation Age of Onset  . Heart disease Mother   . Hypertension Mother   . Healthy Father 43    neck fracture  . Cancer Brother  prostate  . Coronary artery disease      family history of     Review of Systems: The review of systems is per the HPI.  All other systems were reviewed and are negative.  Physical Exam: BP (!) 162/84   Pulse 81   Ht 5\' 3"  (1.6 m)   Wt 132 lb 12.8 oz (60.2 kg)   BMI 23.52 kg/m  Patient is very pleasant and in no acute distress. She has a significant tremor. Skin is warm and dry. Color is normal.  HEENT is unremarkable. Normocephalic/atraumatic. PERRL. Sclera are nonicteric. Neck is supple. No masses. No JVD. Lungs are clear. Cardiac exam shows a regular rate and rhythm. Abdomen is soft. Extremities are without edema. Gait and ROM are intact. No gross neurologic deficits noted.   LABORATORY DATA:  Lab Results  Component Value Date   WBC 11.4 (H) 04/01/2015   HGB 12.1 04/01/2015   HCT 36.6 04/01/2015   PLT 245 04/01/2015    GLUCOSE 107 (H) 04/01/2015   CHOL 222 (H) 10/29/2013   TRIG 187 (H) 10/29/2013   HDL 59 10/29/2013   LDLCALC 126 (H) 10/29/2013   ALT 15 03/31/2015   AST 21 03/31/2015   NA 141 04/01/2015   K 4.2 04/01/2015   CL 107 04/01/2015   CREATININE 1.05 (H) 04/01/2015   BUN 23 (H) 04/01/2015   CO2 25 04/01/2015   TSH 3.073 10/29/2013   INR 2.4 06/15/2015      Assessment / Plan:  1. PAF - remains in sinus today by exam.  Continue Tikosyn.  Continue Eliquis for anticoagulation. If she has more frequent breakthrough would consider switching amlodipine to diltiazem or a beta blocker.   2. Hypertension, fair control. I would not increase meds with prior hypotension.  3. History of CVA.  4. Chronic anticoagulation- now on Eliquis.  Follow up in 6 months.

## 2015-10-11 ENCOUNTER — Other Ambulatory Visit: Payer: Self-pay | Admitting: Cardiology

## 2015-10-21 ENCOUNTER — Other Ambulatory Visit: Payer: Self-pay | Admitting: Cardiology

## 2015-10-23 NOTE — Telephone Encounter (Signed)
Rx(s) sent to pharmacy electronically.  

## 2015-10-28 ENCOUNTER — Other Ambulatory Visit: Payer: Self-pay | Admitting: Cardiology

## 2015-11-09 ENCOUNTER — Other Ambulatory Visit: Payer: Medicare Other | Admitting: Internal Medicine

## 2015-11-09 DIAGNOSIS — Z0000 Encounter for general adult medical examination without abnormal findings: Secondary | ICD-10-CM

## 2015-11-09 LAB — CBC WITH DIFFERENTIAL/PLATELET
BASOS PCT: 1 %
Basophils Absolute: 87 cells/uL (ref 0–200)
EOS ABS: 261 {cells}/uL (ref 15–500)
Eosinophils Relative: 3 %
HEMATOCRIT: 41.6 % (ref 35.0–45.0)
HEMOGLOBIN: 13.9 g/dL (ref 11.7–15.5)
Lymphocytes Relative: 22 %
Lymphs Abs: 1914 cells/uL (ref 850–3900)
MCH: 28.5 pg (ref 27.0–33.0)
MCHC: 33.4 g/dL (ref 32.0–36.0)
MCV: 85.2 fL (ref 80.0–100.0)
MONO ABS: 522 {cells}/uL (ref 200–950)
MPV: 9.3 fL (ref 7.5–12.5)
Monocytes Relative: 6 %
NEUTROS ABS: 5916 {cells}/uL (ref 1500–7800)
Neutrophils Relative %: 68 %
Platelets: 364 10*3/uL (ref 140–400)
RBC: 4.88 MIL/uL (ref 3.80–5.10)
RDW: 15 % (ref 11.0–15.0)
WBC: 8.7 10*3/uL (ref 3.8–10.8)

## 2015-11-09 LAB — COMPREHENSIVE METABOLIC PANEL
ALT: 13 U/L (ref 6–29)
AST: 18 U/L (ref 10–35)
Albumin: 4.1 g/dL (ref 3.6–5.1)
Alkaline Phosphatase: 73 U/L (ref 33–130)
BILIRUBIN TOTAL: 0.5 mg/dL (ref 0.2–1.2)
BUN: 18 mg/dL (ref 7–25)
CHLORIDE: 103 mmol/L (ref 98–110)
CO2: 27 mmol/L (ref 20–31)
CREATININE: 0.79 mg/dL (ref 0.60–0.88)
Calcium: 10.5 mg/dL — ABNORMAL HIGH (ref 8.6–10.4)
GLUCOSE: 97 mg/dL (ref 65–99)
Potassium: 4.5 mmol/L (ref 3.5–5.3)
SODIUM: 138 mmol/L (ref 135–146)
Total Protein: 7.3 g/dL (ref 6.1–8.1)

## 2015-11-09 LAB — LIPID PANEL
Cholesterol: 228 mg/dL — ABNORMAL HIGH (ref 125–200)
HDL: 71 mg/dL (ref >=46)
LDL CALC: 133 mg/dL — AB (ref <130)
Total CHOL/HDL Ratio: 3.2 Ratio (ref <=5.0)
Triglycerides: 118 mg/dL (ref <150)
VLDL: 24 mg/dL (ref <30)

## 2015-11-09 LAB — TSH: TSH: 3.39 m[IU]/L

## 2015-11-10 LAB — VITAMIN D 25 HYDROXY (VIT D DEFICIENCY, FRACTURES): Vit D, 25-Hydroxy: 30 ng/mL (ref 30–100)

## 2015-11-13 ENCOUNTER — Encounter: Payer: Self-pay | Admitting: Internal Medicine

## 2015-11-13 ENCOUNTER — Ambulatory Visit (INDEPENDENT_AMBULATORY_CARE_PROVIDER_SITE_OTHER): Payer: Medicare Other | Admitting: Internal Medicine

## 2015-11-13 VITALS — BP 142/80 | HR 77 | Temp 98.4°F | Ht 59.5 in | Wt 132.5 lb

## 2015-11-13 DIAGNOSIS — Z23 Encounter for immunization: Secondary | ICD-10-CM

## 2015-11-13 DIAGNOSIS — Z853 Personal history of malignant neoplasm of breast: Secondary | ICD-10-CM | POA: Diagnosis not present

## 2015-11-13 DIAGNOSIS — Z8639 Personal history of other endocrine, nutritional and metabolic disease: Secondary | ICD-10-CM | POA: Diagnosis not present

## 2015-11-13 DIAGNOSIS — Z0000 Encounter for general adult medical examination without abnormal findings: Secondary | ICD-10-CM | POA: Diagnosis not present

## 2015-11-13 DIAGNOSIS — Z8679 Personal history of other diseases of the circulatory system: Secondary | ICD-10-CM | POA: Diagnosis not present

## 2015-11-13 DIAGNOSIS — E038 Other specified hypothyroidism: Secondary | ICD-10-CM

## 2015-11-13 DIAGNOSIS — G20 Parkinson's disease: Secondary | ICD-10-CM | POA: Diagnosis not present

## 2015-11-13 DIAGNOSIS — I10 Essential (primary) hypertension: Secondary | ICD-10-CM

## 2015-11-13 DIAGNOSIS — F411 Generalized anxiety disorder: Secondary | ICD-10-CM

## 2015-11-13 DIAGNOSIS — Z7901 Long term (current) use of anticoagulants: Secondary | ICD-10-CM

## 2015-11-13 DIAGNOSIS — H3530 Unspecified macular degeneration: Secondary | ICD-10-CM | POA: Diagnosis not present

## 2015-11-13 LAB — POCT URINALYSIS DIPSTICK
BILIRUBIN UA: NEGATIVE
GLUCOSE UA: NEGATIVE
KETONES UA: NEGATIVE
Leukocytes, UA: NEGATIVE
Nitrite, UA: NEGATIVE
PH UA: 6.5
Protein, UA: NEGATIVE
RBC UA: NEGATIVE
Spec Grav, UA: 1.01
UROBILINOGEN UA: NEGATIVE

## 2015-11-13 MED ORDER — CARBIDOPA-LEVODOPA 25-100 MG PO TABS: ORAL_TABLET | ORAL | 1 refills | Status: DC

## 2015-11-13 NOTE — Progress Notes (Signed)
Subjective:    Patient ID: [name] [name], female    DOB: 08/29/1920, 82 y.o.   MRN: [id number]  HPI 82 year old Female with long-standing history of atrial fibrillation on chronic anticoagulation therapy in today for health maintenance exam and evaluation of medical problems.She is accompanied by her son.  She is always reluctant to come to the doctor. She says it makes her anxious. She's been followed here since 1993. She is also followed closely by Dr. Peter Martinique, cardiologist.  She developed hypertension around 1983. In 1996 she developed cardiomyopathy with congestive heart failure and was found to have a left lacunar infarction. Cardiac cath at that time was negative.  She and her husband continue to live independently with help from family.  History of right breast cancer diagnosed 65. She had left mastectomy January 2001. Has had right knee replacement with Dr. Reynaldo Minium July 2005. Bilateral cataract extractions in 2006. Tonsillectomy in the late 1930s. Hysterectomy with partial oophorectomy 1956. Benign breast biopsy 1985. Cholecystectomy 1989. History of lumbar nerve root impingement L4-L5 November 2005. History of iron deficiency anemia 2008. History of PVCs. History of macular degeneration. History of hypothyroidism, vitamin D deficiency, allergic rhinitis. Was evaluated for mild hypercalcemia with normal intact parathyroid hormone assay in 2011 when calcium was 11.5. Serum calcium was checked February 2012 and was 9.9.  Has been evaluated for dementia and is had normal B-12 and folate levels. In 2010 MRI of the brain with and without contrast was done and was unremarkable except for atrophy and small vessel disease.  No known drug allergies but may have had an adverse reaction to Lexapro. Apparently developed a rash on Dilantin. History of intolerance to amiodarone and Flecainide.  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 Husband retired from [employer]. 2 adult children. Does not smoke or consume alcohol.  Family history: Father died at 70 as a result of a diving accident in which his neck was fractured. Mother died at age 72 with congestive heart failure. 2 brothers, one of them with history of prostate cancer. No sisters.      Review of Systems  Constitutional: Negative.   Psychiatric/Behavioral: Positive for agitation.       Anxious       Objective:   Physical Exam  Constitutional: She appears well-developed and well-nourished. No distress.  HENT:  Head: Normocephalic and atraumatic.  Right Ear: External ear normal.  Left Ear: External ear normal.  Mouth/Throat: Oropharynx is clear and moist.  Eyes: Conjunctivae and EOM are normal. Pupils are equal, round, and reactive to light. Right eye exhibits no discharge. Left eye exhibits no discharge.  Neck: Neck supple. No JVD present. No thyromegaly present.  Cardiovascular: Normal heart sounds.   Irregular irregular rhythm  Pulmonary/Chest: No respiratory distress. She has no wheezes. She has no rales.  Abdominal: Soft. Bowel sounds are normal. There is no tenderness. There is no rebound and no guarding.  Genitourinary:  Genitourinary Comments: Deferred due to age  Musculoskeletal: She exhibits no edema.  Neurological: She is alert. She has normal reflexes. No cranial nerve deficit.  I feel she has cogwheel rigidity in the upper extremities bilaterally. She has upper extremity tremors  Skin: Skin is warm and dry. No rash noted. She is not diaphoretic.  Psychiatric:  Anxious          Assessment & Plan:  Chronic paroxysmal atrial fibrillation-on  Eliquis   Parkinsonism-start Sinemet 25/100 one half  tablet twice daily with food. Reevaluate in 3 months. She may not tolerate this medication. Started with low dose and will titrate slowly.  Dementia-does not know day of the week but does know year and is nearly rhinal month  thinking it was September. Does not know presently [place].  History of breast cancer  Anxiety    Plan: Trial of Sinemet in follow-up in 3 months.  Flu vaccine given today  Spoke at length with her son. He feels she is getting along about the same for the most part. Continues to live independently with husband.  Subjective:   Patient presents for Medicare Annual/Subsequent preventive examination.  Review Past Medical/Family/Social:See above   Risk Factors  Current exercise habits: Sedentary Dietary issues discussed: Intake seems adequate  Cardiac risk factors:  Depression Screen  (Note: if answer to either of the following is "Yes", a more complete depression screening is indicated)   Over the past two weeks, have you felt down, depressed or hopeless? Yes Over the past two weeks, have you felt little interest or pleasure in doing things? Yes Have you lost interest or pleasure in daily life? Yes Do you often feel hopeless? Yes Do you cry easily over simple problems? No   Activities of Daily Living  In your present state of health, do you have any difficulty performing the following activities?:   Driving? Not driving Managing money? Yes Feeding yourself? No  Getting from bed to chair? Yes Climbing a flight of stairs? Yes Preparing food and eating?: Yes Bathing or showering? Yes Getting dressed: Yes Getting to the toilet? Yes Using the toilet:No  Moving around from place to place: Yes In the past year have you fallen or had a near fall?: Yes Are you sexually active? No  Do you have more than one partner? No   Hearing Difficulties: No  Do you often ask people to speak up or repeat themselves? No  Do you experience ringing or noises in your ears? No  Do you have difficulty understanding soft or whispered voices? Yes Do you feel that you have a problem with memory?Family does Do you often misplace items? No    Home Safety:  Do you have a smoke alarm at  your residence? Yes Do you have grab bars in the bathroom?No Do you have throw rugs in your house? No   Cognitive Testing  Alert? Yes Normal Appearance?Yes  Oriented to person? Yes Place? Yes  Time? Yes  Recall of three objects? Not tested Can perform simple calculations? Not tested Displays appropriate judgment?Yes  Can read the correct time from a watch face?Yes   List the Names of Other Physician/Practitioners you currently use:  See referral list for the physicians patient is currently seeing.  Dr. Martinique, cardiologist   Review of Systems: See above   Objective:     General appearance: Appears stated age and mildly obese  Head: Normocephalic, without obvious abnormality, atraumatic  Eyes: conj clear, EOMi PEERLA  Ears: normal TM's and external ear canals both ears  Nose: Nares normal. Septum midline. Mucosa normal. No drainage or sinus tenderness.  Throat: lips, mucosa, and tongue normal; teeth and gums normal  Neck: no adenopathy, no carotid bruit, no JVD, supple, symmetrical, trachea midline and thyroid not enlarged, symmetric, no tenderness/mass/nodules  No CVA tenderness.  Lungs: clear to auscultation bilaterally   Heart: Irregular rhythm S1, S2 normal, no murmur, click, rub or gallop  Abdomen: soft, non-tender; bowel sounds normal; no masses, no organomegaly  Musculoskeletal: ROM normal in all joints, no crepitus, no deformity, Normal muscle strengthen. Back  is symmetric, no curvature. Skin: Skin color, texture, turgor normal. No rashes or lesions  Lymph nodes: Cervical, supraclavicular, and axillary nodes normal.  Neurologic: CN 2 -12 Normal. Cogwheel rigidity and tremor Psych: Alert & Oriented x 3, Mood appear stable.    Assessment:    Annual wellness medicare exam   Plan:    During the course of the visit the patient was educated and counseled about appropriate screening and preventive services including:   Annual flu vaccine  To get Prevnar 13 at  pharmacy     Patient Instructions (the written plan) was given to the patient.  Medicare Attestation  I have personally reviewed:  The patient's medical and social history  Their use of alcohol, tobacco or illicit drugs  Their current medications and supplements  The patient's functional ability including ADLs,fall risks, home safety risks, cognitive, and hearing and visual impairment  Diet and physical activities  Evidence for depression or mood disorders  The patient's weight, height, BMI, and visual acuity have been recorded in the chart. I have made referrals, counseling, and provided education to the patient based on review of the above and I have provided the patient with a written personalized care plan for preventive services.

## 2015-11-24 ENCOUNTER — Telehealth: Payer: Self-pay

## 2015-11-24 NOTE — Telephone Encounter (Signed)
Son will call back to schedule this appt.

## 2015-12-09 NOTE — Patient Instructions (Signed)
Trial of Sinemet and return in 3 months. To get Prevnar at pharmacy. Flu vaccine given today.

## 2015-12-15 ENCOUNTER — Other Ambulatory Visit: Payer: Self-pay | Admitting: Internal Medicine

## 2015-12-16 ENCOUNTER — Other Ambulatory Visit: Payer: Self-pay | Admitting: Internal Medicine

## 2015-12-23 ENCOUNTER — Other Ambulatory Visit: Payer: Self-pay | Admitting: Internal Medicine

## 2016-01-04 ENCOUNTER — Other Ambulatory Visit: Payer: Self-pay | Admitting: Internal Medicine

## 2016-01-06 NOTE — Telephone Encounter (Signed)
Please call son and see if this med is helping with stiffness in arms

## 2016-01-08 NOTE — Telephone Encounter (Signed)
Patient husband called this morning upset that medication has not been refilled.  Sent rx to pharmacy.

## 2016-01-13 ENCOUNTER — Other Ambulatory Visit: Payer: Self-pay | Admitting: Cardiology

## 2016-01-15 NOTE — Telephone Encounter (Signed)
This has to go to the MD

## 2016-01-16 ENCOUNTER — Other Ambulatory Visit: Payer: Self-pay | Admitting: Cardiology

## 2016-01-17 NOTE — Telephone Encounter (Signed)
OK to refill her Xanax.  Peter Martinique MD, [facility]

## 2016-01-18 ENCOUNTER — Telehealth: Payer: Self-pay | Admitting: Cardiology

## 2016-01-18 NOTE — Telephone Encounter (Signed)
New message    *STAT* If patient is at the pharmacy, call can be transferred to refill team.   1. Which medications need to be refilled? (please list name of each medication and dose if known) Alprazolam 0.25mg    2. Which pharmacy/location (including street and city if local pharmacy) is medication to be sent to? [facility] [place].   3. Do they need a 30 day or 90 day supply? [place]

## 2016-01-19 NOTE — Telephone Encounter (Signed)
Refill sent to the pharmacy electronically yesterday

## 2016-02-01 ENCOUNTER — Other Ambulatory Visit: Payer: Self-pay | Admitting: Cardiology

## 2016-02-14 ENCOUNTER — Other Ambulatory Visit: Payer: Self-pay | Admitting: Cardiology

## 2016-02-14 NOTE — Telephone Encounter (Signed)
REFILL 

## 2016-02-17 ENCOUNTER — Other Ambulatory Visit: Payer: Self-pay | Admitting: Cardiology

## 2016-03-02 ENCOUNTER — Other Ambulatory Visit: Payer: Self-pay | Admitting: Internal Medicine

## 2016-03-03 NOTE — Telephone Encounter (Signed)
Was to have followup about this Rx. Please call her son. Is it helping? Can they bring her to office for me to check and see clinically if helping?

## 2016-03-11 ENCOUNTER — Telehealth: Payer: Self-pay

## 2016-03-11 ENCOUNTER — Other Ambulatory Visit: Payer: Self-pay

## 2016-03-11 MED ORDER — CARBIDOPA-LEVODOPA 25-100 MG PO TABS: ORAL_TABLET | ORAL | 1 refills | Status: DC

## 2016-03-11 NOTE — Telephone Encounter (Signed)
Done, pt's wife scheduled an appt.

## 2016-03-11 NOTE — Telephone Encounter (Signed)
Refill for 2 months 

## 2016-03-11 NOTE — Telephone Encounter (Signed)
Pt's husband called saying that he called about 2 weeks ago to request a refill on the carbidopa, pt has  Been out of this medication for a week. I told him that she was supposed to f/u about this medication husband said he didn't know she was supposed to follow up. He doesn't know if this medication is helping. He said sometimes she can't get out of bed and he doesn't know when he can bring her in. He's requesting refills.

## 2016-03-19 ENCOUNTER — Ambulatory Visit (INDEPENDENT_AMBULATORY_CARE_PROVIDER_SITE_OTHER): Payer: Medicare Other | Admitting: Internal Medicine

## 2016-03-19 ENCOUNTER — Encounter: Payer: Self-pay | Admitting: Internal Medicine

## 2016-03-19 VITALS — BP 140/80 | HR 65 | Wt 133.0 lb

## 2016-03-19 DIAGNOSIS — E039 Hypothyroidism, unspecified: Secondary | ICD-10-CM

## 2016-03-19 DIAGNOSIS — I10 Essential (primary) hypertension: Secondary | ICD-10-CM

## 2016-03-19 DIAGNOSIS — Z7901 Long term (current) use of anticoagulants: Secondary | ICD-10-CM | POA: Diagnosis not present

## 2016-03-19 DIAGNOSIS — F411 Generalized anxiety disorder: Secondary | ICD-10-CM | POA: Diagnosis not present

## 2016-03-19 DIAGNOSIS — Z853 Personal history of malignant neoplasm of breast: Secondary | ICD-10-CM

## 2016-03-19 DIAGNOSIS — Z8679 Personal history of other diseases of the circulatory system: Secondary | ICD-10-CM | POA: Diagnosis not present

## 2016-03-19 DIAGNOSIS — F0390 Unspecified dementia without behavioral disturbance: Secondary | ICD-10-CM

## 2016-03-19 NOTE — Patient Instructions (Signed)
Family will stop Zyprexa and Sinemet and reevaluate patient's condition in 30 days. They will call if questions arise.

## 2016-03-19 NOTE — Progress Notes (Signed)
   Subjective:    Patient ID: [name] [name], female    DOB: September 30, 1920, 83 y.o.   MRN: [id number]  HPI 83 year old Female accompanied by her daughter-in-law today to discussed medication regimen. For number of years she's been maintained on Zyprexa 5 mg at bedtime for agitation. Patient continuously crosses her legs in the exam room. She makes facial grimaces at times. Cannot seem to sit still. Last visit I started her on low-dose Sinemet because I thought she had cogwheel rigidity. Family is not sure that that has improved at all. I think it may have improved just a little bit. In any case, patient's son apparently would like to stop Zyprexa and Sinemet at this point in time and see how things go. Patient and her husband continue to reside alone. She's had one fall recently but was not injured. She is on Eloquis. Daughter-in-law says it became too difficult to get patient to the doctor to have pro time checked. She will go get her hair done otherwise she doesn't like to go out on the house much anymore. Daughter-in-law recently retired so she says she can check in with patient and husband more often. They have rejected help from outside the home in the past saying they would try to manage on their own.  Daughter-in-law says patient sometimes has some issues with short-term memory. She's had what seems to be a hallucination at times thinking someone was in her room.  I asked about a neurology evaluation but daughter-in-law says that patient does not want to pursue that. I ask about a psychiatric consultation for medication management that they do not want to pursue that either.  She has a history of atrial fibrillation. Has appointment to see Dr. Martinique soon.  Remote history of left breast cancer.    Review of Systems see above     Objective:   Physical Exam She knows it's February and she knows the year. No thyromegaly. Chest clear. Cardiac exam regular rate and rhythm. Extremities without  pitting edema. She fidgets continuously in the chair. Occasionally seems emotionally labile. There does seem to be some mild cogwheel rigidity. She has a tremor.       Assessment & Plan:  I feel is likely patient has some mild dementia at her age  She has a tremor  ? Parkinson's disease  Agitation etiology unclear  History of atrial fibrillation  Hypothyroidism  Essential hypertension  History of breast cancer  Plan: Family is going to stop Zyprexa and Sinemet. I think it would be fair to say they can reassess her situation in about 30 days. I'm concerned she may have worsening of agitation and hallucinations but that was pointed out to daughter-in-law today. She will be in contact with me with progress report.

## 2016-03-21 NOTE — Progress Notes (Signed)
[name] [name] Date of Birth: [birth year] Medical Record [id number]  History of Present Illness: [name] [name] is seen for follow up atrial fibrillation. She has a history of atrial fib. Has been on Tikosyn and remains on her coumadin. She is intolerant of amiodarone. Other issues include anxiety, HTN, past CVA, HLD, iron deficiency anemia and prior cardiomyopathy. She has a chronic tremor.  She was admitted overnight 2/17-2/18/17 for a syncopal episode. She was in Afib with RVR. BP was low. She converted spontaneously. This was felt to be triggered by a UTI. Otherwise she has very infrequent episodes of Afib. She is currently asymptomatic. No chest pain, dyspnea or palpitations. She did have a fall in October without injury and unclear if she passed out.  She was seen by Dr. Renold [name] recently. His of agitation and hallucinations. Per family request Sinemet and Zyprexa were stopped to see how she would do. She has been off these only 2 days.    Allergies as of 03/22/2016      Reactions   Escitalopram Oxalate Other (See Comments)   Adverse reaction   Codeine Other (See Comments)   unknown   Phenytoin Sodium Extended Other (See Comments)   unknown      Medication List       Accurate as of 03/22/16  4:04 PM. Always use your most recent med list.          acetaminophen 325 MG tablet Commonly known as:  TYLENOL Take 2 tablets (650 mg total) by mouth every 4 (four) hours as needed for headache or mild pain.   ALPRAZolam 0.25 MG tablet Commonly known as:  XANAX TAKE 1 TABLET BY MOUTH 3 TIMES A DAY AS NEEDED FOR ANXIETY   amLODipine 5 MG tablet Commonly known as:  NORVASC Take 1 tablet (5 mg total) by mouth daily.   apixaban 2.5 MG Tabs tablet Commonly known as:  ELIQUIS Take 1 tablet (2.5 mg total) by mouth 2 (two) times daily.   carbidopa-levodopa 25-100 MG tablet Commonly known as:  SINEMET IR TAKE 1/2 TABLET BY MOUTH TWICE A DAY WITH FOOD   KLOR-CON M20 20 MEQ tablet Generic drug:   potassium chloride SA TAKE 1 TABLET BY MOUTH EVERY DAY   losartan 50 MG tablet Commonly known as:  COZAAR TAKE 1 TABLET (50 MG TOTAL) BY MOUTH DAILY.   multivitamin-iron-minerals-folic acid chewable tablet Chew 1 tablet by mouth daily.   OLANZapine 5 MG tablet Commonly known as:  ZYPREXA TAKE 1 TABLET BY MOUTH AT BEDTIME.   SYNTHROID 50 MCG tablet Generic drug:  levothyroxine TAKE 1 TABLET BY MOUTH EVERY DAY   TIKOSYN 250 MCG capsule Generic drug:  dofetilide TAKE 1 CAPSULE BY MOUTH 2 TIMES DAILY (DAW2)        Allergies  Allergen Reactions  . Escitalopram Oxalate Other (See Comments)    Adverse reaction  . Codeine Other (See Comments)    unknown  . Phenytoin Sodium Extended Other (See Comments)    unknown    Past Medical History:  Diagnosis Date  . Anemia   . Anxiety   . Anxiety   . Atrial fibrillation ([place])   . Cerebrovascular accident ([place])   . Diverticula of colon   . H/O: hysterectomy   . Hyperlipidemia   . Hypertension   . Iron deficiency   . Malignant neoplasm of breast [facility])    personal history of  . Myocardial infarction     Past Surgical History:  Procedure Laterality Date  .  CHOLECYSTECTOMY    . EYE SURGERY     bil-cataract  . MASTECTOMY     left breast  . TOTAL KNEE ARTHROPLASTY      History  Smoking Status  . Never Smoker  Smokeless Tobacco  . Never Used    History  Alcohol Use No    Family History  Problem Relation Age of Onset  . Heart disease Mother   . Hypertension Mother   . Healthy Father 43    neck fracture  . Cancer Brother     prostate  . Coronary artery disease      family history of     Review of Systems: The review of systems is per the HPI.  All other systems were reviewed and are negative.  Physical Exam: BP (!) 178/70   Pulse 70   Ht 5\' 2"  (1.575 m)   Wt 131 lb (59.4 kg)   BMI 23.96 kg/m  Patient is very pleasant and in no acute distress. She has a significant tremor. Skin is warm and dry. Color  is normal.  HEENT is unremarkable. Normocephalic/atraumatic. PERRL. Sclera are nonicteric. Neck is supple. No masses. No JVD. Lungs are clear. Cardiac exam shows a regular rate and rhythm. Abdomen is soft. Extremities are without edema. Gait and ROM are intact. No gross neurologic deficits noted.   LABORATORY DATA:  Lab Results  Component Value Date   WBC 8.7 11/09/2015   HGB 13.9 11/09/2015   HCT 41.6 11/09/2015   PLT 364 11/09/2015   GLUCOSE 97 11/09/2015   CHOL 228 (H) 11/09/2015   TRIG 118 11/09/2015   HDL 71 11/09/2015   LDLCALC 133 (H) 11/09/2015   ALT 13 11/09/2015   AST 18 11/09/2015   NA 138 11/09/2015   K 4.5 11/09/2015   CL 103 11/09/2015   CREATININE 0.79 11/09/2015   BUN 18 11/09/2015   CO2 27 11/09/2015   TSH 3.39 11/09/2015   INR 2.4 06/15/2015    Ecg today shows NSR with PACs. QTc 412 msec. Otherwise normal. I have personally reviewed and interpreted this study.   Assessment / Plan:  1. PAF - remains in sinus.  Continue Tikosyn.  Continue Eliquis for anticoagulation. QTc is normal. Last potassium and renal profile normal.   2. Hypertension, elevated with white coat HTN. I would not increase meds with prior hypotension.  3. History of CVA.  4. Chronic anticoagulation- now on Eliquis.  Follow up in 6 months.

## 2016-03-22 ENCOUNTER — Other Ambulatory Visit: Payer: Self-pay | Admitting: Internal Medicine

## 2016-03-22 ENCOUNTER — Ambulatory Visit (INDEPENDENT_AMBULATORY_CARE_PROVIDER_SITE_OTHER): Payer: Medicare Other | Admitting: Cardiology

## 2016-03-22 ENCOUNTER — Encounter: Payer: Self-pay | Admitting: Cardiology

## 2016-03-22 VITALS — BP 178/70 | HR 70 | Ht 62.0 in | Wt 131.0 lb

## 2016-03-22 DIAGNOSIS — I10 Essential (primary) hypertension: Secondary | ICD-10-CM | POA: Diagnosis not present

## 2016-03-22 DIAGNOSIS — I480 Paroxysmal atrial fibrillation: Secondary | ICD-10-CM

## 2016-03-22 MED ORDER — AMLODIPINE BESYLATE 5 MG PO TABS: 5.0000 mg | ORAL_TABLET | Freq: Every day | ORAL | 3 refills | Status: DC

## 2016-03-22 NOTE — Patient Instructions (Signed)
Continue your current therapy  I will see you in 6 months.   

## 2016-04-05 ENCOUNTER — Ambulatory Visit: Payer: Medicare Other | Admitting: Cardiology

## 2016-04-10 ENCOUNTER — Ambulatory Visit: Payer: Medicare Other | Admitting: Cardiology

## 2016-04-13 ENCOUNTER — Other Ambulatory Visit: Payer: Self-pay | Admitting: Internal Medicine

## 2016-04-15 ENCOUNTER — Other Ambulatory Visit: Payer: Self-pay

## 2016-04-15 ENCOUNTER — Telehealth: Payer: Self-pay | Admitting: Internal Medicine

## 2016-04-15 MED ORDER — OLANZAPINE 5 MG PO TABS: 5.0000 mg | ORAL_TABLET | Freq: Every day | ORAL | 0 refills | Status: DC

## 2016-04-15 NOTE — Telephone Encounter (Signed)
I got a refill request for Zyprexa and denied it not knowing this info. Please call pharmacy and reoorder. She needs to see neurologist.

## 2016-04-15 NOTE — Telephone Encounter (Signed)
Refilled, [name] [name] said he's going to talk to her and see if she agrees going to see a neurologist bc " she doesn't want see new doctors" he will call back to let us know so we can place a referral to neuro.

## 2016-04-15 NOTE — Telephone Encounter (Signed)
Son, Louie Casa is calling and states that he was to call and provide information regarding his Mom once they took her off of Zyprexa and Sinemet IR.  States that it was sometime around the middle of February.    When they first took her off, she seemed to be doing a little bit better.  However, as time has been going on towards the 2nd and 3rd week, she has digressed with regards toward her shaking which they felt was maybe early Parkinson's.  Her appetite wasn't as good as it was in the past.  She had a few episodes where she couldn't sleep at night.  She seemed a little more zomby like.  She would just sit and stare into space and not have much to say at all.    This all culminated last Saturday morning when his Dad called him about 8:30; he told them that he couldn't get the patient to take her medicine, couldn't get her to eat her breakfast.  She was standing in the middle of the room and he couldn't understand anything that she was saying.  So, Louie Casa went over to their home and he gave her a Zyprexa at 9:45 a.m. And he was able to get his Mom to eat breakfast.  He told his Dad to give her another Zyprexa at 8:00 p.m. And start the Sinemet IR back as well at 8:00 p.m. (that was Saturday, 3/3).    Yesterday morning, his Dad called them and said you wouldn't believe the change in your Mom since Saturday.  She is talking to me, she's eating her breakfast.  So, last evening, his wife went by the house and states that the patient seemed to be much better, she could understand what she was saying she spoke and seemed to be in a better place.   Son wants to know if there's anything that we need to do?     [facility]andy[facility] #  [phone]

## 2016-04-24 ENCOUNTER — Telehealth: Payer: Self-pay | Admitting: Internal Medicine

## 2016-04-24 NOTE — Telephone Encounter (Signed)
Son, [name] [name] is calling to request a written prescription for a shower chair for the patient.  They are going to begin using the services of [facility].  They will come in to provide assistance to the patient and her husband.  At this time, all they really need is the shower chair.    The patient does NOT want to be referred to a Neurologist.  And, she is going to remain on her medications as prescribed.  They have started her back on her medications as you have prescribed them to her and she will continue to take them as such (Sinemet IR and Zyprexa).  They made some adjustments to her Xanax, but Dr. Martinique prescribes that for her.  She is taking 1 at 8:00 a.m., 1/2 at 2:00 p.m. And 1 at 8:00 p.m.  This seems to be providing her with a little better sleep at bedtime.

## 2016-04-24 NOTE — Telephone Encounter (Signed)
Prescription for shower chair written

## 2016-05-11 ENCOUNTER — Other Ambulatory Visit: Payer: Self-pay | Admitting: Internal Medicine

## 2016-05-13 NOTE — Telephone Encounter (Signed)
Verbal order by Dr. Renold Genta to refill Zyprexa 5mg .  Dispense #30, 3 refills.  [phone]; left voicemail.

## 2016-05-16 ENCOUNTER — Other Ambulatory Visit: Payer: Self-pay | Admitting: Cardiology

## 2016-05-16 NOTE — Telephone Encounter (Signed)
REFILL 

## 2016-05-26 ENCOUNTER — Other Ambulatory Visit: Payer: Self-pay | Admitting: Internal Medicine

## 2016-07-27 ENCOUNTER — Other Ambulatory Visit: Payer: Self-pay | Admitting: Cardiology

## 2016-07-29 NOTE — Telephone Encounter (Signed)
Controlled med for anxiety. Not [facility] refill

## 2016-08-01 MED ORDER — ALPRAZOLAM 0.25 MG PO TABS: ORAL_TABLET | ORAL | 1 refills | Status: DC

## 2016-08-01 NOTE — Telephone Encounter (Signed)
Ok to refill Xanax  Peter Jordan MD, FACC   

## 2016-08-01 NOTE — Addendum Note (Signed)
Addended by: Kathyrn Lass on: 08/01/2016 09:20 AM   Modules accepted: Orders

## 2016-08-16 ENCOUNTER — Other Ambulatory Visit: Payer: Self-pay | Admitting: Internal Medicine

## 2016-08-16 NOTE — Telephone Encounter (Signed)
Verbal order by Dr. Renold Genta to refill Sinemet IR 25-100 tablet #30, 2 refills.  Sending to [place] to e-scribe.

## 2016-09-12 ENCOUNTER — Other Ambulatory Visit: Payer: Self-pay | Admitting: Internal Medicine

## 2016-09-14 ENCOUNTER — Other Ambulatory Visit: Payer: Self-pay | Admitting: Cardiology

## 2016-09-16 NOTE — Telephone Encounter (Signed)
REFILL 

## 2016-10-04 ENCOUNTER — Other Ambulatory Visit: Payer: Self-pay | Admitting: Cardiology

## 2016-10-05 ENCOUNTER — Other Ambulatory Visit: Payer: Self-pay | Admitting: Cardiology

## 2016-10-07 ENCOUNTER — Other Ambulatory Visit: Payer: Self-pay | Admitting: Cardiology

## 2016-10-07 MED ORDER — ALPRAZOLAM 0.25 MG PO TABS: ORAL_TABLET | ORAL | 3 refills | Status: DC

## 2016-10-07 NOTE — Telephone Encounter (Signed)
Ok to refill Xanax  Bernice Mullin Martinique MD, [facility]

## 2016-10-07 NOTE — Addendum Note (Signed)
Addended by: Kathyrn Lass on: 10/07/2016 09:54 AM   Modules accepted: Orders

## 2016-10-08 ENCOUNTER — Other Ambulatory Visit: Payer: Self-pay | Admitting: Cardiology

## 2016-10-08 NOTE — Telephone Encounter (Signed)
Rx(s) sent to pharmacy electronically.  

## 2016-10-09 NOTE — Telephone Encounter (Signed)
REFILL 

## 2016-11-06 ENCOUNTER — Other Ambulatory Visit: Payer: Self-pay | Admitting: Internal Medicine

## 2016-11-30 NOTE — Progress Notes (Signed)
[name] [name] Date of Birth: [birth year] Medical Record #[id number]  History of Present Illness: [name] [name] is seen for follow up atrial fibrillation. She has a history of atrial fib. Has been on Tikosyn and remains on her coumadin. She is intolerant of amiodarone. Other issues include anxiety, HTN, past CVA, HLD, iron deficiency anemia and prior cardiomyopathy. She has a chronic tremor.  On follow up today she is doing well. She denies any chest pain or SOB. Doesn't note any Afib episodes. Hallucinations are better. She is required more attention from her spouse. No bleeding noted.    Allergies as of 12/05/2016      Reactions   Escitalopram Oxalate Other (See Comments)   Adverse reaction   Codeine Other (See Comments)   unknown   Phenytoin Sodium Extended Other (See Comments)   unknown      Medication List       Accurate as of 12/05/16  8:16 PM. Always use your most recent med list.          acetaminophen 325 MG tablet Commonly known as:  TYLENOL Take 2 tablets (650 mg total) by mouth every 4 (four) hours as needed for headache or mild pain.   ALPRAZolam 0.25 MG tablet Commonly known as:  XANAX TAKE 1 TAB BY MOUTH THREE TIMES DAILY FOR ANXIETY   amLODipine 5 MG tablet Commonly known as:  NORVASC TAKE 1 TABLET (5 MG TOTAL) BY MOUTH AT BEDTIME.   carbidopa-levodopa 25-100 MG tablet Commonly known as:  SINEMET IR TAKE 1/2 TABLET BY MOUTH TWICE A DAY WITH FOOD   ELIQUIS 2.5 MG Tabs tablet Generic drug:  apixaban TAKE 1 TABLET (2.5 MG TOTAL) BY MOUTH 2 (TWO) TIMES DAILY.   KLOR-CON M20 20 MEQ tablet Generic drug:  potassium chloride SA TAKE 1 TABLET BY MOUTH EVERY DAY   losartan 50 MG tablet Commonly known as:  COZAAR TAKE 1 TABLET EVERY DAY   multivitamin-iron-minerals-folic acid chewable tablet Chew 1 tablet by mouth daily.   OLANZapine 5 MG tablet Commonly known as:  ZYPREXA TAKE 1 TABLET BY MUTH AT BEDTIME   SYNTHROID 50 MCG tablet Generic drug:   levothyroxine TAKE 1 TABLET BY MOUTH EVERY DAY   TIKOSYN 250 MCG capsule Generic drug:  dofetilide TAKE 1 CAPSULE BY MOUTH 2 TIMES DAILY (DAW2)        Allergies  Allergen Reactions  . Escitalopram Oxalate Other (See Comments)    Adverse reaction  . Codeine Other (See Comments)    unknown  . Phenytoin Sodium Extended Other (See Comments)    unknown    Past Medical History:  Diagnosis Date  . Anemia   . Anxiety   . Anxiety   . Atrial fibrillation ([place])   . Cerebrovascular accident ([place])   . Diverticula of colon   . H/O: hysterectomy   . Hyperlipidemia   . Hypertension   . Iron deficiency   . Malignant neoplasm of breast [facility])    personal history of  . Myocardial infarction [facility])     Past Surgical History:  Procedure Laterality Date  . CHOLECYSTECTOMY    . EYE SURGERY     bil-cataract  . MASTECTOMY     left breast  . TOTAL KNEE ARTHROPLASTY      History  Smoking Status  . Never Smoker  Smokeless Tobacco  . Never Used    History  Alcohol Use No    Family History  Problem Relation Age of Onset  . Heart disease Mother   .  Hypertension Mother   . Healthy Father 43       neck fracture  . Cancer Brother        prostate  . Coronary artery disease Unknown        family history of     Review of Systems: The review of systems is per the HPI.  All other systems were reviewed and are negative.  Physical Exam: BP (!) 154/68   Pulse 72   Ht 5\' 1"  (1.549 m)   Wt 130 lb (59 kg)   BMI 24.56 kg/m  GENERAL:  Elderly WF, anxious. Global tremor. HEENT:  PERRL, EOMI, sclera are clear. Oropharynx is clear. NECK:  No jugular venous distention, carotid upstroke brisk and symmetric, no bruits, no thyromegaly or adenopathy LUNGS:  Clear to auscultation bilaterally CHEST:  Unremarkable HEART:  RRR,  PMI not displaced or sustained,S1 and S2 within normal limits, no S3, no S4: no clicks, no rubs, no murmurs ABD:  Soft, nontender. BS +, no masses or bruits. No  hepatomegaly, no splenomegaly EXT:  2 + pulses throughout, no edema, no cyanosis no clubbing SKIN:  Warm and dry.  No rashes NEURO:  Alert and oriented x 3. Cranial nerves II through XII intact.   LABORATORY DATA:  Lab Results  Component Value Date   WBC 8.7 11/09/2015   HGB 13.9 11/09/2015   HCT 41.6 11/09/2015   PLT 364 11/09/2015   GLUCOSE 97 11/09/2015   CHOL 228 (H) 11/09/2015   TRIG 118 11/09/2015   HDL 71 11/09/2015   LDLCALC 133 (H) 11/09/2015   ALT 13 11/09/2015   AST 18 11/09/2015   NA 138 11/09/2015   K 4.5 11/09/2015   CL 103 11/09/2015   CREATININE 0.79 11/09/2015   BUN 18 11/09/2015   CO2 27 11/09/2015   TSH 3.39 11/09/2015   INR 2.4 06/15/2015     Assessment / Plan:  1. PAF - well controlled on Tikosyn.   Continue Eliquis for anticoagulation. QTc has been  Normal. We will update labs today including chemistries and TSH.   2. Hypertension, mildly elevated with significant component of white coat HTN. I would not increase meds with prior hypotension.  3. History of CVA.  4. Chronic anticoagulation-  on Eliquis. Check CBC  Follow up in 6 months.

## 2016-12-05 ENCOUNTER — Encounter: Payer: Self-pay | Admitting: Cardiology

## 2016-12-05 ENCOUNTER — Other Ambulatory Visit: Payer: Self-pay | Admitting: Internal Medicine

## 2016-12-05 ENCOUNTER — Ambulatory Visit (INDEPENDENT_AMBULATORY_CARE_PROVIDER_SITE_OTHER): Payer: Medicare Other | Admitting: Cardiology

## 2016-12-05 VITALS — BP 154/68 | HR 72 | Ht 61.0 in | Wt 130.0 lb

## 2016-12-05 DIAGNOSIS — I10 Essential (primary) hypertension: Secondary | ICD-10-CM | POA: Diagnosis not present

## 2016-12-05 DIAGNOSIS — Z7901 Long term (current) use of anticoagulants: Secondary | ICD-10-CM

## 2016-12-05 DIAGNOSIS — I480 Paroxysmal atrial fibrillation: Secondary | ICD-10-CM

## 2016-12-05 NOTE — Patient Instructions (Signed)
We will check blood work today  I will see you in 6 months

## 2016-12-07 ENCOUNTER — Other Ambulatory Visit: Payer: Self-pay | Admitting: Internal Medicine

## 2016-12-08 LAB — CBC WITH DIFFERENTIAL/PLATELET
BASOS: 1 %
Basophils Absolute: 0.1 10*3/uL (ref 0.0–0.2)
EOS (ABSOLUTE): 0.3 10*3/uL (ref 0.0–0.4)
EOS: 4 %
Hematocrit: 39.9 % (ref 34.0–46.6)
Hemoglobin: 13.5 g/dL (ref 11.1–15.9)
IMMATURE GRANULOCYTES: 0 %
Immature Grans (Abs): 0 10*3/uL (ref 0.0–0.1)
LYMPHS: 22 %
Lymphocytes Absolute: 1.5 10*3/uL (ref 0.7–3.1)
MCH: 28.9 pg (ref 26.6–33.0)
MCHC: 33.8 g/dL (ref 31.5–35.7)
MCV: 85 fL (ref 79–97)
MONOCYTES: 7 %
Monocytes Absolute: 0.5 10*3/uL (ref 0.1–0.9)
NEUTROS ABS: 4.4 10*3/uL (ref 1.4–7.0)
NEUTROS PCT: 66 %
PLATELETS: 313 10*3/uL (ref 150–379)
RBC: 4.67 x10E6/uL (ref 3.77–5.28)
RDW: 14.3 % (ref 12.3–15.4)
WBC: 6.7 10*3/uL (ref 3.4–10.8)

## 2016-12-08 LAB — COMPREHENSIVE METABOLIC PANEL
A/G RATIO: 1.6 (ref 1.2–2.2)
ALK PHOS: 89 IU/L (ref 39–117)
ALT: 14 IU/L (ref 0–32)
AST: 17 IU/L (ref 0–40)
Albumin: 4.5 g/dL (ref 3.2–4.6)
BUN/Creatinine Ratio: 23 (ref 12–28)
BUN: 22 mg/dL (ref 10–36)
Bilirubin Total: 0.3 mg/dL (ref 0.0–1.2)
CALCIUM: 10.2 mg/dL (ref 8.7–10.3)
CHLORIDE: 101 mmol/L (ref 96–106)
CO2: 22 mmol/L (ref 20–29)
Creatinine, Ser: 0.95 mg/dL (ref 0.57–1.00)
GFR calc Af Amer: 59 mL/min/{1.73_m2} — ABNORMAL LOW (ref >59)
GFR, EST NON AFRICAN AMERICAN: 51 mL/min/{1.73_m2} — AB (ref >59)
Globulin, Total: 2.8 g/dL (ref 1.5–4.5)
Glucose: 143 mg/dL — ABNORMAL HIGH (ref 65–99)
POTASSIUM: 4.8 mmol/L (ref 3.5–5.2)
Sodium: 140 mmol/L (ref 134–144)
Total Protein: 7.3 g/dL (ref 6.0–8.5)

## 2016-12-08 LAB — TSH: TSH: 3.69 u[IU]/mL (ref 0.450–4.500)

## 2017-01-20 ENCOUNTER — Other Ambulatory Visit: Payer: Self-pay | Admitting: Internal Medicine

## 2017-01-31 ENCOUNTER — Other Ambulatory Visit: Payer: Self-pay | Admitting: Cardiology

## 2017-03-06 ENCOUNTER — Other Ambulatory Visit: Payer: Self-pay | Admitting: Internal Medicine

## 2017-03-06 NOTE — Telephone Encounter (Signed)
Spoke with son he said he will call back tomorrow to schedule.

## 2017-03-06 NOTE — Telephone Encounter (Signed)
Due for OV in February after the 18th. Please contact son and make appt before refilling.

## 2017-03-08 ENCOUNTER — Other Ambulatory Visit: Payer: Self-pay | Admitting: Cardiology

## 2017-03-10 NOTE — Telephone Encounter (Signed)
Ok to refill alprazolam  Gar Glance Martinique MD, [facility]

## 2017-03-26 ENCOUNTER — Other Ambulatory Visit: Payer: Self-pay | Admitting: Cardiology

## 2017-04-04 ENCOUNTER — Other Ambulatory Visit: Payer: Self-pay | Admitting: Cardiology

## 2017-04-04 NOTE — Telephone Encounter (Signed)
REFILL 

## 2017-04-22 ENCOUNTER — Other Ambulatory Visit: Payer: Self-pay | Admitting: Internal Medicine

## 2017-04-24 ENCOUNTER — Other Ambulatory Visit: Payer: Self-pay | Admitting: Cardiology

## 2017-05-05 ENCOUNTER — Other Ambulatory Visit: Payer: Medicare Other | Admitting: Internal Medicine

## 2017-05-05 DIAGNOSIS — E038 Other specified hypothyroidism: Secondary | ICD-10-CM

## 2017-05-05 DIAGNOSIS — G20 Parkinson's disease: Secondary | ICD-10-CM

## 2017-05-05 DIAGNOSIS — Z8639 Personal history of other endocrine, nutritional and metabolic disease: Secondary | ICD-10-CM

## 2017-05-05 DIAGNOSIS — H3530 Unspecified macular degeneration: Secondary | ICD-10-CM

## 2017-05-05 DIAGNOSIS — Z0000 Encounter for general adult medical examination without abnormal findings: Secondary | ICD-10-CM

## 2017-05-05 DIAGNOSIS — E785 Hyperlipidemia, unspecified: Secondary | ICD-10-CM

## 2017-05-06 LAB — COMPLETE METABOLIC PANEL WITH GFR
AG Ratio: 1.6 (calc) (ref 1.0–2.5)
ALT: 7 U/L (ref 6–29)
AST: 14 U/L (ref 10–35)
Albumin: 4.5 g/dL (ref 3.6–5.1)
Alkaline phosphatase (APISO): 80 U/L (ref 33–130)
BUN: 16 mg/dL (ref 7–25)
CALCIUM: 10.3 mg/dL (ref 8.6–10.4)
CO2: 29 mmol/L (ref 20–32)
CREATININE: 0.86 mg/dL (ref 0.60–0.88)
Chloride: 104 mmol/L (ref 98–110)
GFR, EST AFRICAN AMERICAN: 67 mL/min/{1.73_m2} (ref >=60)
GFR, EST NON AFRICAN AMERICAN: 58 mL/min/{1.73_m2} — AB (ref >=60)
GLUCOSE: 95 mg/dL (ref 65–99)
Globulin: 2.9 g/dL (calc) (ref 1.9–3.7)
Potassium: 4.5 mmol/L (ref 3.5–5.3)
Sodium: 141 mmol/L (ref 135–146)
TOTAL PROTEIN: 7.4 g/dL (ref 6.1–8.1)
Total Bilirubin: 0.5 mg/dL (ref 0.2–1.2)

## 2017-05-06 LAB — CBC WITH DIFFERENTIAL/PLATELET
BASOS ABS: 83 {cells}/uL (ref 0–200)
Basophils Relative: 1.4 %
EOS ABS: 389 {cells}/uL (ref 15–500)
Eosinophils Relative: 6.6 %
HEMATOCRIT: 42.8 % (ref 35.0–45.0)
HEMOGLOBIN: 14.3 g/dL (ref 11.7–15.5)
LYMPHS ABS: 1623 {cells}/uL (ref 850–3900)
MCH: 29 pg (ref 27.0–33.0)
MCHC: 33.4 g/dL (ref 32.0–36.0)
MCV: 86.8 fL (ref 80.0–100.0)
MPV: 9.4 fL (ref 7.5–12.5)
Monocytes Relative: 7.6 %
NEUTROS ABS: 3357 {cells}/uL (ref 1500–7800)
Neutrophils Relative %: 56.9 %
Platelets: 358 10*3/uL (ref 140–400)
RBC: 4.93 10*6/uL (ref 3.80–5.10)
RDW: 13.2 % (ref 11.0–15.0)
Total Lymphocyte: 27.5 %
WBC mixed population: 448 cells/uL (ref 200–950)
WBC: 5.9 10*3/uL (ref 3.8–10.8)

## 2017-05-06 LAB — LIPID PANEL
Cholesterol: 227 mg/dL — ABNORMAL HIGH (ref <200)
HDL: 75 mg/dL (ref >50)
LDL Cholesterol (Calc): 129 mg/dL (calc) — ABNORMAL HIGH
NON-HDL CHOLESTEROL (CALC): 152 mg/dL — AB (ref <130)
Total CHOL/HDL Ratio: 3 (calc) (ref <5.0)
Triglycerides: 118 mg/dL (ref <150)

## 2017-05-06 LAB — VITAMIN D 25 HYDROXY (VIT D DEFICIENCY, FRACTURES): VIT D 25 HYDROXY: 34 ng/mL (ref 30–100)

## 2017-05-06 LAB — TSH: TSH: 4.12 mIU/L (ref 0.40–4.50)

## 2017-05-09 ENCOUNTER — Encounter: Payer: Self-pay | Admitting: Internal Medicine

## 2017-05-09 ENCOUNTER — Ambulatory Visit (INDEPENDENT_AMBULATORY_CARE_PROVIDER_SITE_OTHER): Payer: Medicare Other | Admitting: Internal Medicine

## 2017-05-09 VITALS — BP 160/90 | HR 70 | Ht 60.0 in | Wt 125.0 lb

## 2017-05-09 DIAGNOSIS — I10 Essential (primary) hypertension: Secondary | ICD-10-CM

## 2017-05-09 DIAGNOSIS — Z853 Personal history of malignant neoplasm of breast: Secondary | ICD-10-CM | POA: Diagnosis not present

## 2017-05-09 DIAGNOSIS — R238 Other skin changes: Secondary | ICD-10-CM

## 2017-05-09 DIAGNOSIS — Z8679 Personal history of other diseases of the circulatory system: Secondary | ICD-10-CM

## 2017-05-09 DIAGNOSIS — G20 Parkinson's disease: Secondary | ICD-10-CM | POA: Diagnosis not present

## 2017-05-09 DIAGNOSIS — Z8639 Personal history of other endocrine, nutritional and metabolic disease: Secondary | ICD-10-CM

## 2017-05-09 DIAGNOSIS — L909 Atrophic disorder of skin, unspecified: Secondary | ICD-10-CM

## 2017-05-09 DIAGNOSIS — F0390 Unspecified dementia without behavioral disturbance: Secondary | ICD-10-CM

## 2017-05-09 DIAGNOSIS — F411 Generalized anxiety disorder: Secondary | ICD-10-CM | POA: Diagnosis not present

## 2017-05-09 DIAGNOSIS — Z0000 Encounter for general adult medical examination without abnormal findings: Secondary | ICD-10-CM | POA: Diagnosis not present

## 2017-05-09 DIAGNOSIS — Z7901 Long term (current) use of anticoagulants: Secondary | ICD-10-CM | POA: Diagnosis not present

## 2017-05-09 MED ORDER — LEVOTHYROXINE SODIUM 75 MCG PO TABS: 75.0000 ug | ORAL_TABLET | Freq: Every day | ORAL | 3 refills | Status: DC

## 2017-05-09 MED ORDER — CARBIDOPA-LEVODOPA 25-100 MG PO TABS: ORAL_TABLET | ORAL | 3 refills | Status: AC

## 2017-05-09 MED ORDER — OLANZAPINE 5 MG PO TABS: ORAL_TABLET | ORAL | 3 refills | Status: AC

## 2017-05-09 NOTE — Progress Notes (Signed)
Subjective:    Patient ID: [name] [name], female    DOB: 10-Aug-1920, 84 y.o.   MRN: [id number]  HPI Here for Medicare Wellness, routine health maintenance and evaluation of medical issues.  Accompanied by her daughter-in-law who helps take care of her.  Patient is now 84 years old.  She is reluctant to come to the doctor.  Seldom leaves her home except to get her hair done at beauty shop.  Refuses to see neurologist.  Will see Dr. Martinique, cardiologist.  She developed hypertension around 1983.  In 1996 she developed cardiomyopathy with congestive heart failure and was found to have a left lacunar infarction.  Cardiac cath at that time was negative.  She has hypothyroidism and hypertension.  Takes Xanax for anxiety and agitation.  Takes Sinemet and olanzapine.  She is on Eliquis for history of atrial fibrillation.  She is on Tikosyn.  She and her husband continue to live independently with help from the family.  History of right breast cancer diagnosed 11.  She had left mastectomy in January 2001.  Has had right knee replacement July 2005.  Bilateral cataract extractions in 2006.  Tonsillectomy late 49s.  Hysterectomy with partial oophorectomy 1956.  Benign breast biopsy 1985.  Cholecystectomy 1989.  History of lumbar nerve root impingement L4-L5 November 2005.  Iron deficiency anemia 2008.  History of PVCs.  History of macular degeneration.  History of hypothyroidism, vitamin D deficiency, allergic rhinitis.  Was evaluated for mild hypercalcemia with normal intact parathyroid hormone assay in 2011 when her calcium was 11.5.  Subsequently calcium normalized in 2012.  Is been evaluated for dementia with normal B12 and folate levels.  In 2010 MRI of the brain with and without contrast was done and was unremarkable except for atrophy and small vessel disease.  No known drug allergies but may have had an adverse reaction to Lexapro.  Apparently developed a rash on Dilantin.  History of intolerance  to amiodarone and flecainide.  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  Husband is retired from [employer].  2 adult children.  Does not smoke or consume alcohol.  Family history: Father died at age 39 as a result of a diving accident in which his neck was fractured.  Mother died at age 91 with congestive heart failure.  2 brothers 1 of them with history of prostate cancer.  No sisters.     Review of Systems chronically anxious and agitated at times-does well with Zyprexa     Objective:   Physical Exam  Constitutional: She appears well-developed and well-nourished. No distress.  HENT:  Head: Normocephalic and atraumatic.  Right Ear: External ear normal.  Left Ear: External ear normal.  Mouth/Throat: Oropharynx is clear and moist.  Eyes: Right eye exhibits no discharge. Left eye exhibits no discharge. No scleral icterus.  Neck: Neck supple. No JVD present. No thyromegaly present.  Cardiovascular:  Irregular rhythm rate controlled  Pulmonary/Chest: She has no wheezes. She has no rales.  Wrist exam not done as patient will not undress  Abdominal:  No abdominal masses  Genitourinary:  Genitourinary Comments: Patient refuses to undress so this exam was deferred  Musculoskeletal: She exhibits no edema.  Lymphadenopathy:    She has no cervical adenopathy.  Neurological: She is alert.  Cooperative  Skin: Skin is warm and dry. She is not diaphoretic.  She has an area of skin breakdown that appears to be  healed at the present time left buttock but breaks down from time to time according to daughter-in-law  Vitals reviewed.  She  has bilateral hand tremors in upper extremity cogwheel rigidity.  Her affect is flat and she mumbles.  She has masked bases with little expression.  Skin is warm and dry.  She has a small area of skin breakdown left buttock area.  She cannot name name of [place],  day of week, year or month.  She does seem to recognize me.  Repeats my name when told.  Recognizes Dr. Doug Sou name when called.         Assessment & Plan:  Dementia-family does not want evaluation.  We will check B12 and folate levels when she returns in 6 weeks for follow-up on hypothyroidism.  Parkinson's disease-continue Sinemet  Atrial fibrillation on chronic anticoagulation  Hypothyroidism-TSH is in the 4 range and increase levothyroxine to 0.075 mg daily with follow-up in 6 weeks  Complaint of strong odor of urine-could not give urine specimen today.  Family will obtain urine specimen at home and bring back to be checked  Skin breakdown left buttock area-send home health nurse out for evaluation and treatment.  Currently there is no active skin breakdown but an area that is prone to breakdown  Hypertension  Anxiety and agitation continue olanzapine and Xanax as needed  Weight loss-has lost 8 pounds since last year.  Serum proteins are normal.  Appetite  fairly good according to daughter-in-law.  Continue to monitor.  Plan: See above.  Family prefers comfort care and not an aggressive workup of dementia.    Subjective:   Patient presents for Medicare Annual/Subsequent preventive examination.  Review Past Medical/Family/Social: See above  Risk Factors  Current exercise habits: Sedentary Dietary issues discussed: Discussed with daughter-in-law  Cardiac risk factors  Depression Screen  (Note: if answer to either of the following is "Yes", a more complete depression screening is indicated)   Over the past two weeks, have you felt down, depressed or hopeless?  Yes Over the past two weeks, have you felt little interest or pleasure in doing things?  Yes Have you lost interest or pleasure in daily life?  Yes Do you often feel hopeless?  Yes Do you cry easily over simple problems? No   Activities of Daily Living  In your present state of health, do you have any  difficulty performing the following activities?:   Driving?  Does not drive Managing money?  Does not manage money Feeding yourself? No  Getting from bed to chair? No  Climbing a flight of stairs?  Yes Preparing food and eating?:  Yes forgetful Bathing or showering?  Needs assistance.  Gets a bath once a week. Getting dressed: Yes  needs assistance Getting to the toilet? No  Using the toilet: Sometimes Moving around from place to place: Yes ambulates slowly in the past year have you fallen or had a near fall?:  Yes but was not injured  are you sexually active? No  Do you have more than one partner? No   Hearing Difficulties: No  Do you often ask people to speak up or repeat themselves? No  Do you experience ringing or noises in your ears? No  answer Do you have difficulty understanding soft or whispered voices? No  Do you feel that you have a problem with memory? yes Do you often misplace items? yes memory loss   Home Safety:  Do you have a smoke alarm at  your residence? Yes Do you have grab bars in the bathroom?  Yes Do you have throw rugs in your house?  No   Cognitive Testing  Alert? Yes Normal   Appearance?Yes  Oriented to person? Yes    Place?  No Time?  no Recall of three objects?  No Can perform simple calculations?  no Displays appropriate judgment?  Not assessed Can read the correct time from a watch face? not tested  List the Names of Other Physician/Practitioners you currently use:  See referral list for the physicians patient is currently seeing.  Dr. Peter Martinique   Review of Systems: See above   Objective:     General appearance: Appears younger than stated age and thin Head: Normocephalic, without obvious abnormality, atraumatic  Eyes: conj clear, EOMi PEERLA  Ears: normal TM's and external ear canals both ears  Nose: Nares normal. Septum midline. Mucosa normal. No drainage or sinus tenderness.  Throat: lips, mucosa, and tongue normal; teeth and  gums normal  Neck: no adenopathy, no carotid bruit, no JVD, supple, symmetrical, trachea midline and thyroid not enlarged, symmetric, no tenderness/mass/nodules  No CVA tenderness.  Lungs: clear to auscultation bilaterally  Breasts: Not examined patient refuses to undress Heart: See above irregular contractions Abdomen: Refuses to undress-no gross masses Musculoskeletal-cogwheel rigidity Skin: Skin color, texture, turgor normal. No rashes or lesions  Lymph nodes:  not examined Neurologic:  Psych: Alert.  Not oriented to day of week, year, month.  Mood appears stable.    Assessment:    Annual wellness medicare exam   Plan:    During the course of the visit the patient was educated and counseled about appropriate screening and preventive services including:        Patient Instructions (the written plan) was given to the patient.  Medicare Attestation  I have personally reviewed:  The patient's medical and social history  Their use of alcohol, tobacco or illicit drugs  Their current medications and supplements  The patient's functional ability including ADLs,fall risks, home safety risks, cognitive, and hearing and visual impairment  Diet and physical activities  Evidence for depression or mood disorders  The patient's weight, height, BMI, and visual acuity have been recorded in the chart. I have made referrals, counseling, and provided education to the patient based on review of the above and I have provided the patient with a written personalized care plan for preventive services.       Marland Kitchen

## 2017-05-09 NOTE — Patient Instructions (Addendum)
It was a pleasure to see you today.  Continue same medications.  We will contact home health nurse regarding skin breakdown on buttock.  Return in 1 year or as needed.  Continue same medications.  Increase thyroid replacement medication to 0.075 mg daily.  Repeat TSH in 6-8 weeks without office visit.  Family will bring urine specimen back to the office from home.

## 2017-05-16 ENCOUNTER — Telehealth: Payer: Self-pay | Admitting: Internal Medicine

## 2017-05-16 NOTE — Telephone Encounter (Signed)
Kim from [facility] called to inform Dr Renold Genta that the patient was admitted today for nursing services.

## 2017-05-19 ENCOUNTER — Other Ambulatory Visit: Payer: Self-pay | Admitting: Cardiology

## 2017-05-20 NOTE — Telephone Encounter (Signed)
REFILL 

## 2017-06-05 ENCOUNTER — Other Ambulatory Visit: Payer: Self-pay | Admitting: Internal Medicine

## 2017-06-11 ENCOUNTER — Encounter: Payer: Self-pay | Admitting: Cardiology

## 2017-06-13 ENCOUNTER — Telehealth: Payer: Self-pay | Admitting: Emergency Medicine

## 2017-06-13 NOTE — Telephone Encounter (Signed)
Error

## 2017-06-16 DIAGNOSIS — G20 Parkinson's disease: Secondary | ICD-10-CM | POA: Diagnosis not present

## 2017-06-19 ENCOUNTER — Other Ambulatory Visit: Payer: Self-pay | Admitting: Internal Medicine

## 2017-06-19 DIAGNOSIS — E039 Hypothyroidism, unspecified: Secondary | ICD-10-CM

## 2017-06-19 DIAGNOSIS — F0390 Unspecified dementia without behavioral disturbance: Secondary | ICD-10-CM

## 2017-06-22 ENCOUNTER — Other Ambulatory Visit: Payer: Self-pay | Admitting: Cardiology

## 2017-06-25 NOTE — Telephone Encounter (Signed)
Rx(s) sent to pharmacy electronically.  

## 2017-06-30 NOTE — Progress Notes (Signed)
[name] [name] Date of Birth: 1920-12-01 Medical Record #[id number]  History of Present Illness: [name] [name] is seen for follow up atrial fibrillation. She has a history of atrial fib. Has been on Tikosyn and has been switched from coumadin to Eliquis for anticoagulation. She is intolerant of amiodarone. Other issues include anxiety, HTN, past CVA, HLD, iron deficiency anemia and prior cardiomyopathy. She has a chronic tremor.    On follow up today she is doing well. She denies any chest pain or SOB. She is unaware of any Afib but tends to deny any symptoms. No edema. Daughter notes that thyroid dose was increased 6 weeks ago and TSH yesterday improved from 4.1>>1.35.    Allergies as of 07/02/2017      Reactions   Escitalopram Oxalate Other (See Comments)   Adverse reaction   Codeine Other (See Comments)   unknown   Phenytoin Sodium Extended Other (See Comments)   unknown      Medication List        Accurate as of 07/02/17  3:42 PM. Always use your most recent med list.          acetaminophen 325 MG tablet Commonly known as:  TYLENOL Take 2 tablets (650 mg total) by mouth every 4 (four) hours as needed for headache or mild pain.   ALPRAZolam 0.25 MG tablet Commonly known as:  XANAX TAKE 1 TABLET BY MOUTH 3 TIMES A DAY AS NEEDED FOR ANXIETY   carbidopa-levodopa 25-100 MG tablet Commonly known as:  SINEMET IR TAKE 1/2 TABLET BY MOUTH TWICE A DAY WITH FOOD   ELIQUIS 2.5 MG Tabs tablet Generic drug:  apixaban TAKE 1 TABLET (2.5 MG TOTAL) BY MOUTH 2 (TWO) TIMES DAILY.   losartan 50 MG tablet Commonly known as:  COZAAR TAKE 1 TABLET BY MOUTH EVERY DAY   multivitamin-iron-minerals-folic acid chewable tablet Chew 1 tablet by mouth daily.   NORVASC 5 MG tablet Generic drug:  amLODipine TAKE 1 TABLET (5 MG TOTAL) BY MOUTH AT BEDTIME.   OLANZapine 5 MG tablet Commonly known as:  ZYPREXA TAKE 1 TABLET BY MUTH AT BEDTIME   potassium chloride SA 20 MEQ tablet Commonly known  as:  KLOR-CON M20 Take 1 tablet (20 mEq total) by mouth daily.   SYNTHROID 50 MCG tablet Generic drug:  levothyroxine TAKE 1 TABLET BY MOUTH EVERY DAY   TIKOSYN 250 MCG capsule Generic drug:  dofetilide TAKE 1 CAPSULE BY MOUTH 2 TIMES DAILY (DAW2)        Allergies  Allergen Reactions  . Escitalopram Oxalate Other (See Comments)    Adverse reaction  . Codeine Other (See Comments)    unknown  . Phenytoin Sodium Extended Other (See Comments)    unknown    Past Medical History:  Diagnosis Date  . Anemia   . Anxiety   . Anxiety   . Atrial fibrillation ([place])   . Cerebrovascular accident ([place])   . Diverticula of colon   . H/O: hysterectomy   . Hyperlipidemia   . Hypertension   . Iron deficiency   . Malignant neoplasm of breast [facility])    personal history of  . Myocardial infarction [facility])     Past Surgical History:  Procedure Laterality Date  . CHOLECYSTECTOMY    . EYE SURGERY     bil-cataract  . MASTECTOMY     left breast  . TOTAL KNEE ARTHROPLASTY      Social History   Tobacco Use  Smoking Status Never Smoker  Smokeless  Tobacco Never Used    Social History   Substance and Sexual Activity  Alcohol Use No    Family History  Problem Relation Age of Onset  . Heart disease Mother   . Hypertension Mother   . Healthy Father 43       neck fracture  . Cancer Brother        prostate  . Coronary artery disease Unknown        family history of     Review of Systems: The review of systems is per the HPI.  All other systems were reviewed and are negative.  Physical Exam: BP 132/70   Pulse 66   Ht 5' 2.5" (1.588 m)   Wt 120 lb (54.4 kg)   BMI 21.60 kg/m  GENERAL:  Elderly WF in wheelchair. Anxious.  HEENT:  PERRL, EOMI, sclera are clear. Oropharynx is clear. NECK:  No jugular venous distention, carotid upstroke brisk and symmetric, no bruits, no thyromegaly or adenopathy LUNGS:  Clear to auscultation bilaterally CHEST:  Unremarkable HEART:  RRR,   PMI not displaced or sustained,S1 and S2 within normal limits, no S3, no S4: no clicks, no rubs, no murmurs ABD:  Soft, nontender. BS +, no masses or bruits. No hepatomegaly, no splenomegaly EXT:  2 + pulses throughout, no edema, no cyanosis no clubbing SKIN:  Warm and dry.  No rashes NEURO:  Alert and oriented x 3. Cranial nerves II through XII intact. Global tremor. PSYCH:  Cognitively intact     LABORATORY DATA:  Lab Results  Component Value Date   WBC 5.9 05/05/2017   HGB 14.3 05/05/2017   HCT 42.8 05/05/2017   PLT 358 05/05/2017   GLUCOSE 95 05/05/2017   CHOL 227 (H) 05/05/2017   TRIG 118 05/05/2017   HDL 75 05/05/2017   LDLCALC 129 (H) 05/05/2017   ALT 7 05/05/2017   AST 14 05/05/2017   NA 141 05/05/2017   K 4.5 05/05/2017   CL 104 05/05/2017   CREATININE 0.86 05/05/2017   BUN 16 05/05/2017   CO2 29 05/05/2017   TSH 1.35 07/01/2017   INR 2.4 06/15/2015   Ecg today shows NSR with rate 66. QTc 444 msec. Normal. I have personally reviewed and interpreted this study.   Assessment / Plan:  1. PAF - well controlled on Tikosyn.   Continue Eliquis for anticoagulation. QTc has been  Normal.    2. Hypertension, well controlled  3. History of CVA.  4. Chronic anticoagulation-  on Eliquis.   Follow up in 6 months.

## 2017-07-01 ENCOUNTER — Other Ambulatory Visit: Payer: Medicare Other | Admitting: Internal Medicine

## 2017-07-01 DIAGNOSIS — F0390 Unspecified dementia without behavioral disturbance: Secondary | ICD-10-CM

## 2017-07-01 DIAGNOSIS — E039 Hypothyroidism, unspecified: Secondary | ICD-10-CM

## 2017-07-02 ENCOUNTER — Encounter: Payer: Self-pay | Admitting: Cardiology

## 2017-07-02 ENCOUNTER — Ambulatory Visit: Payer: Medicare Other | Admitting: Cardiology

## 2017-07-02 VITALS — BP 132/70 | HR 66 | Ht 62.5 in | Wt 120.0 lb

## 2017-07-02 DIAGNOSIS — I10 Essential (primary) hypertension: Secondary | ICD-10-CM | POA: Diagnosis not present

## 2017-07-02 DIAGNOSIS — Z7901 Long term (current) use of anticoagulants: Secondary | ICD-10-CM | POA: Diagnosis not present

## 2017-07-02 DIAGNOSIS — I480 Paroxysmal atrial fibrillation: Secondary | ICD-10-CM

## 2017-07-02 LAB — FOLATE

## 2017-07-02 LAB — TSH: TSH: 1.35 mIU/L (ref 0.40–4.50)

## 2017-07-02 LAB — VITAMIN B12: Vitamin B-12: 1329 pg/mL — ABNORMAL HIGH (ref 200–1100)

## 2017-07-02 NOTE — Patient Instructions (Signed)
Continue your current therapy  Follow up in 6 months   

## 2017-07-25 ENCOUNTER — Other Ambulatory Visit: Payer: Self-pay | Admitting: Cardiology

## 2017-07-25 NOTE — Telephone Encounter (Signed)
Rx request sent to pharmacy.  

## 2017-08-17 ENCOUNTER — Other Ambulatory Visit: Payer: Self-pay | Admitting: Cardiology

## 2017-09-09 ENCOUNTER — Other Ambulatory Visit: Payer: Self-pay | Admitting: Cardiology

## 2017-09-09 NOTE — Telephone Encounter (Signed)
Order Providers   Prescribing Provider Encounter Provider  Martinique, Peter M, MD Martinique, Peter M, MD  Outpatient Medication Detail    Disp Refills Start End   KLOR-CON M20 20 MEQ tablet 30 tablet 1 08/18/2017    Sig: TAKE 1 TABLET BY MOUTH EVERY DAY   Sent to pharmacy as: KLOR-CON M20 20 MEQ tablet   E-Prescribing Status: Receipt confirmed by pharmacy (08/18/2017 4:18 PM EDT)   Pharmacy   [id number] - [place], [place] - [place].

## 2017-09-13 ENCOUNTER — Other Ambulatory Visit: Payer: Self-pay | Admitting: Cardiology

## 2017-09-15 NOTE — Telephone Encounter (Signed)
OK to refill Xanax with 3 refills.  Zykerria Tanton Martinique MD, [facility]

## 2017-09-21 ENCOUNTER — Other Ambulatory Visit: Payer: Self-pay | Admitting: Cardiology

## 2017-09-22 ENCOUNTER — Telehealth: Payer: Self-pay | Admitting: Cardiology

## 2017-09-22 NOTE — Telephone Encounter (Signed)
New Message    *STAT* If patient is at the pharmacy, call can be transferred to refill team.   1. Which medications need to be refilled? (please list name of each medication and dose if known) ALPRAZolam (XANAX) 0.25 MG tablet  2. Which pharmacy/location (including street and city if local pharmacy) is medication to be sent to? [id number] - [place], [place]  3. Do they need a 30 day or 90 day supply? [place]

## 2017-09-22 NOTE — Telephone Encounter (Signed)
PRESCRIPTION  REFILLED X2

## 2017-09-22 NOTE — Telephone Encounter (Signed)
Phone -in prescription to [facility] pharmacy  Quantity of 90  With x1 refill

## 2017-09-23 ENCOUNTER — Telehealth: Payer: Self-pay | Admitting: Cardiology

## 2017-09-23 NOTE — Telephone Encounter (Signed)
New Message     *STAT* If patient is at the pharmacy, call can be transferred to refill team.   1. Which medications need to be refilled? (please list name of each medication and dose if known) ALPRAZolam (XANAX) 0.25 MG tablet  2. Which pharmacy/location (including street and city if local pharmacy) is medication to be sent to? [id number] [place], [place] - [place].  3. Do they need a 30 day or 90 day supply? 90 day    Patients son is requesting that a new rx for 90 days be sent to the pharmacy

## 2017-09-23 NOTE — Telephone Encounter (Signed)
Please call in Xanax 90 day supply as requested

## 2017-10-01 MED ORDER — ALPRAZOLAM 0.25 MG PO TABS: ORAL_TABLET | ORAL | 3 refills | Status: AC

## 2017-10-01 NOTE — Telephone Encounter (Signed)
[name], can you please call this in so it can be documented in Epic likewise.    Thank you.

## 2017-10-09 ENCOUNTER — Other Ambulatory Visit: Payer: Self-pay | Admitting: Cardiology

## 2017-11-10 ENCOUNTER — Other Ambulatory Visit: Payer: Self-pay | Admitting: Cardiology

## 2017-12-21 ENCOUNTER — Other Ambulatory Visit: Payer: Self-pay | Admitting: Cardiology

## 2017-12-25 ENCOUNTER — Telehealth: Payer: Self-pay | Admitting: Cardiology

## 2017-12-25 NOTE — Telephone Encounter (Signed)
Returned call to patient's daughter n Sports coach.She wanted to let Dr.Jordan know patient does not feel like coming to appointment this Monday 12/29/17.Stated she does not like to leave home,hard to walk.She wanted to make sure Dr.Jordan would refill medications.Advised I will speak to Dr.Jordan and let her know.

## 2017-12-25 NOTE — Telephone Encounter (Signed)
New Message   Pt's daughter calling, states that the pt might not make the appt on 11/18 at 4 due to mobility issues and a health situation, wants to know would that affect her getting her prescription. Please call

## 2017-12-29 ENCOUNTER — Ambulatory Visit: Payer: Medicare Other | Admitting: Cardiology

## 2017-12-31 NOTE — Telephone Encounter (Signed)
Spoke to patient's daughter n law Manus Gunning Dr.Jordan will refill patient's medications.

## 2018-01-05 ENCOUNTER — Other Ambulatory Visit: Payer: Self-pay | Admitting: Cardiology

## 2018-01-16 ENCOUNTER — Emergency Department (HOSPITAL_COMMUNITY)
Admission: EM | Admit: 2018-01-16 | Discharge: 2018-01-16 | Disposition: A | Discharge status: Home / Self Care | Payer: Medicare Other | Attending: Emergency Medicine | Admitting: Emergency Medicine

## 2018-01-16 ENCOUNTER — Encounter (HOSPITAL_COMMUNITY): Payer: Self-pay

## 2018-01-16 ENCOUNTER — Other Ambulatory Visit: Payer: Self-pay

## 2018-01-16 ENCOUNTER — Emergency Department (HOSPITAL_COMMUNITY): Payer: Medicare Other

## 2018-01-16 DIAGNOSIS — E039 Hypothyroidism, unspecified: Secondary | ICD-10-CM | POA: Diagnosis not present

## 2018-01-16 DIAGNOSIS — W19XXXA Unspecified fall, initial encounter: Secondary | ICD-10-CM

## 2018-01-16 DIAGNOSIS — Z7901 Long term (current) use of anticoagulants: Secondary | ICD-10-CM | POA: Diagnosis not present

## 2018-01-16 DIAGNOSIS — Y92000 Kitchen of unspecified non-institutional (private) residence as  the place of occurrence of the external cause: Secondary | ICD-10-CM | POA: Diagnosis not present

## 2018-01-16 DIAGNOSIS — Y998 Other external cause status: Secondary | ICD-10-CM | POA: Insufficient documentation

## 2018-01-16 DIAGNOSIS — I10 Essential (primary) hypertension: Secondary | ICD-10-CM | POA: Diagnosis not present

## 2018-01-16 DIAGNOSIS — Z853 Personal history of malignant neoplasm of breast: Secondary | ICD-10-CM | POA: Diagnosis not present

## 2018-01-16 DIAGNOSIS — W0110XA Fall on same level from slipping, tripping and stumbling with subsequent striking against unspecified object, initial encounter: Secondary | ICD-10-CM | POA: Insufficient documentation

## 2018-01-16 DIAGNOSIS — S0101XA Laceration without foreign body of scalp, initial encounter: Secondary | ICD-10-CM

## 2018-01-16 DIAGNOSIS — Y92009 Unspecified place in unspecified non-institutional (private) residence as the place of occurrence of the external cause: Secondary | ICD-10-CM

## 2018-01-16 DIAGNOSIS — Y939 Activity, unspecified: Secondary | ICD-10-CM | POA: Diagnosis not present

## 2018-01-16 DIAGNOSIS — S0990XA Unspecified injury of head, initial encounter: Secondary | ICD-10-CM

## 2018-01-16 NOTE — ED Triage Notes (Signed)
Pt brought in with family. Pt fell at home around 12 noon. Unsure of fall mechanism- pt does not remember. Pt hit head, and takes eloquis. Lac on right side of head. Pt is at her baseline per family (pt is disoriented to time only).

## 2018-01-16 NOTE — Discharge Instructions (Addendum)
Return here as needed.  Keep the area clean and dry.  Do not vigorously scrub over brush the area.  Have the staples out in 7 days.

## 2018-01-16 NOTE — ED Notes (Signed)
Pt escorted with Son and Dtr in [address]. Reports that pt had an un-witness fall at home today . Family reports that they are not sure if she had any LOC or cause of fall. Per family pt use a walker, pt has hx of falls / Pt sustained a LAC to the left side of posterior of her head.

## 2018-01-16 NOTE — ED Provider Notes (Signed)
[facility] DEPT Provider Note   CSN: [id number] Arrival date & time: 01/16/18  1424     History   Chief Complaint Chief Complaint  Patient presents with  . Fall  . Head Laceration    HPI [name] [name] is a 84 y.o. female.  HPI Patient presents to the emergency department with injuries following a fall.  The patient was at home and they feel like she fell backwards hitting the back of her head.  Patient was sitting in the kitchen when this occurred.  There is unknown loss of consciousness.  The patient has dementia and is unable to give me any history.  Patient has no other injuries of the laceration noted to the back of the head.  She is not able to give any history. Past Medical History:  Diagnosis Date  . Anemia   . Anxiety   . Anxiety   . Atrial fibrillation ([place])   . Cerebrovascular accident ([place])   . Diverticula of colon   . H/O: hysterectomy   . Hyperlipidemia   . Hypertension   . Iron deficiency   . Malignant neoplasm of breast [facility])    personal history of  . Myocardial infarction [facility])     Patient Active Problem List   Diagnosis Date Noted  . Hypothyroidism 03/19/2016  . Parkinson's disease ([place]) 11/13/2015  . Atrial fibrillation with RVR ([place]) 03/31/2015  . Syncope and collapse 03/31/2015  . Tremor 03/31/2015  . UTI (lower urinary tract infection) 03/31/2015  . Syncope 03/31/2015  . Anxiety   . Macular degeneration 05/14/2011  . Hyperlipidemia 05/14/2011  . Dementia ([place]) 05/14/2011  . Allergic rhinitis 05/14/2011  . Anticoagulant long-term use 08/01/2010  . ANEMIA 02/09/2007  . ANXIETY 02/09/2007  . Essential hypertension 02/09/2007  . H/O PAF- CHADs VASc= 6 02/09/2007  . History of CVA (cerebrovascular accident) 02/09/2007  . PERSONAL HISTORY OF MALIGNANT NEOPLASM OF BREAST 02/09/2007  . COLONIC POLYPS, ADENOMATOUS 09/02/2006  . DIVERTICULOSIS OF COLON 11/29/2002    Past Surgical History:  Procedure  Laterality Date  . CHOLECYSTECTOMY    . EYE SURGERY     bil-cataract  . MASTECTOMY     left breast  . TOTAL KNEE ARTHROPLASTY       OB History   None      Home Medications    Prior to Admission medications   Medication Sig Start Date End Date Taking? Authorizing Provider  acetaminophen (TYLENOL) 325 MG tablet Take 2 tablets (650 mg total) by mouth every 4 (four) hours as needed for headache or mild pain. 04/01/15   Erlene Quan, PA-C  ALPRAZolam (XANAX) 0.25 MG tablet TAKE 1 TABLET BY MOUTH 3 TIMES A DAY AS NEEDED FOR ANXIETY 10/01/17   [name], MD  carbidopa-levodopa (SINEMET IR) 25-100 MG tablet TAKE 1/2 TABLET BY MOUTH TWICE A DAY WITH FOOD 05/09/17   [name], MD  ELIQUIS 2.5 MG TABS tablet TAKE 1 TABLET BY MOUTH TWICE A DAY 09/22/17   Martinique, Peter M, MD  KLOR-CON M20 20 MEQ tablet TAKE 1 TABLET BY MOUTH EVERY DAY 01/05/18   [place], Peter M, MD  losartan (COZAAR) 50 MG tablet TAKE 1 TABLET BY MOUTH EVERY DAY 12/22/17   [place], Peter M, MD  multivitamin-iron-minerals-folic acid (CENTRUM) chewable tablet Chew 1 tablet by mouth daily.      [provider]  NORVASC 5 MG tablet TAKE 1 TABLET BY MOUTH EVERYDAY AT BEDTIME 11/10/17   [place], Peter  M, MD  OLANZapine (ZYPREXA) 5 MG tablet TAKE 1 TABLET BY MUTH AT BEDTIME 05/09/17   [name], MD  SYNTHROID 50 MCG tablet TAKE 1 TABLET BY MOUTH EVERY DAY 06/05/17   [name], MD  TIKOSYN 250 MCG capsule TAKE 1 CAPSULE BY MOUTH 2 TIMES DAILY (DAW2) 07/25/17   [place], Peter M, MD    Family History Family History  Problem Relation Age of Onset  . Heart disease Mother   . Hypertension Mother   . Healthy Father 43       neck fracture  . Cancer Brother        prostate  . Coronary artery disease Unknown        family history of     Social History Social History   Tobacco Use  . Smoking status: Never Smoker  . Smokeless tobacco: Never Used  Substance Use Topics  . Alcohol use: No  . Drug use: No      Allergies   Escitalopram oxalate; Codeine; and Phenytoin sodium extended   Review of Systems Review of Systems  All other systems negative except as documented in the HPI. All pertinent positives and negatives as reviewed in the HPI. Physical Exam Updated Vital Signs BP (!) 185/83 (BP Location: Left Arm)   Pulse 82   Temp 98.2 F (36.8 C) (Oral)   Resp 18   Ht 5\' 2"  (1.575 m)   Wt 59 kg   SpO2 100%   BMI 23.78 kg/m   Physical Exam  Constitutional: She appears well-developed and well-nourished. No distress.  HENT:  Head: Normocephalic.    Mouth/Throat: Oropharynx is clear and moist.  Eyes: Pupils are equal, round, and reactive to light.  Neck: Normal range of motion. Neck supple.  Cardiovascular: Normal rate, regular rhythm and normal heart sounds. Exam reveals no gallop and no friction rub.  No murmur heard. Pulmonary/Chest: Effort normal and breath sounds normal. No respiratory distress. She has no wheezes.  Neurological: She is alert. She exhibits normal muscle tone. Coordination normal.  Skin: Skin is warm and dry. Capillary refill takes less than 2 seconds. No rash noted. No erythema.  Psychiatric: She has a normal mood and affect. Her behavior is normal.  Nursing note and vitals reviewed.    ED Treatments / Results  Labs (all labs ordered are listed, but only abnormal results are displayed) Labs Reviewed - No data to display  EKG None  Radiology Ct Head Wo Contrast  Result Date: 01/16/2018 CLINICAL DATA:  Head laceration after fall. EXAM: CT HEAD WITHOUT CONTRAST TECHNIQUE: Contiguous axial images were obtained from the base of the skull through the vertex without intravenous contrast. COMPARISON:  CT scan of March 20, 2010. FINDINGS: Brain: Mild chronic ischemic white matter disease. Minimal diffuse cortical atrophy. No mass effect or midline shift is noted. Ventricular size is within normal limits. There is no evidence of mass lesion, hemorrhage or  acute infarction. Vascular: No hyperdense vessel or unexpected calcification. Skull: Normal. Negative for fracture or focal lesion. Sinuses/Orbits: No acute finding. Other: None. IMPRESSION: Mild chronic ischemic white matter disease. Minimal diffuse cortical atrophy. No acute intracranial abnormality seen. Electronically Signed   By: Marijo Conception, M.D.   On: 01/16/2018 15:49    Procedures Procedures (including critical care time)  Medications Ordered in ED Medications - No data to display   Initial Impression / Assessment and Plan / ED Course  I have reviewed the triage vital signs and the nursing notes.  Pertinent labs & imaging results that were available during my care of the patient were reviewed by me and considered in my medical decision making (see chart for details).    Patient has no neurological deficits noted on exam.  The patient has a negative CT scan at this time.  I did inform the family that she could have a slow bleeding area that is yet to declare itself on CT scan.  I advised him to look for any changes in her normal condition.   LACERATION REPAIR Performed by: Brent General Authorized by: Resa Miner Jaimi Belle Consent: Verbal consent obtained. Risks and benefits: risks, benefits and alternatives were discussed Consent given by: patient Patient identity confirmed: provided demographic data Prepped and Draped in normal sterile fashion Wound explored  Laceration Location: Right posterior superior scalp.  Laceration Length: 3 cm  No Foreign Bodies seen or palpated  Anesthesia: local infiltration  Local anesthetic: None  Anesthetic total: N/A ml  Irrigation method: syringe Amount of cleaning: standard  Skin closure: Staples  Number of staples: 4  Technique: Staples  Patient tolerance: Patient tolerated the procedure well with no immediate complications.   Final Clinical Impressions(s) / ED Diagnoses   Final diagnoses:  None    ED  Discharge Orders    None       Dalia Heading, PA-C 01/16/18 1801    Julianne Rice, MD 01/16/18 2328

## 2018-01-19 ENCOUNTER — Telehealth: Payer: Self-pay

## 2018-01-19 NOTE — Telephone Encounter (Signed)
Louie Casa called states he took mom to the ER on Friday after a fall she got some staples put on the top of her head and he would like to know if he can bring her here to remove them on Friday. He would also like to talk about putting her in a nursing home.

## 2018-01-19 NOTE — Telephone Encounter (Signed)
Scheduled

## 2018-01-19 NOTE — Telephone Encounter (Signed)
Make an appt for Friday

## 2018-01-21 ENCOUNTER — Other Ambulatory Visit: Payer: Self-pay | Admitting: Cardiology

## 2018-01-23 ENCOUNTER — Ambulatory Visit: Payer: Medicare Other | Admitting: Internal Medicine

## 2018-01-25 ENCOUNTER — Other Ambulatory Visit: Payer: Self-pay

## 2018-01-25 ENCOUNTER — Encounter (HOSPITAL_COMMUNITY): Payer: Self-pay

## 2018-01-25 ENCOUNTER — Emergency Department (HOSPITAL_COMMUNITY): Payer: Medicare Other

## 2018-01-25 ENCOUNTER — Emergency Department (HOSPITAL_COMMUNITY)
Admission: EM | Admit: 2018-01-25 | Discharge: 2018-01-25 | Disposition: A | Discharge status: Home / Self Care | Payer: Medicare Other | Attending: Emergency Medicine | Admitting: Emergency Medicine

## 2018-01-25 DIAGNOSIS — Z4800 Encounter for change or removal of nonsurgical wound dressing: Secondary | ICD-10-CM | POA: Insufficient documentation

## 2018-01-25 DIAGNOSIS — R4182 Altered mental status, unspecified: Secondary | ICD-10-CM | POA: Diagnosis present

## 2018-01-25 DIAGNOSIS — I10 Essential (primary) hypertension: Secondary | ICD-10-CM | POA: Diagnosis not present

## 2018-01-25 DIAGNOSIS — R627 Adult failure to thrive: Secondary | ICD-10-CM

## 2018-01-25 DIAGNOSIS — E860 Dehydration: Secondary | ICD-10-CM

## 2018-01-25 DIAGNOSIS — Z79899 Other long term (current) drug therapy: Secondary | ICD-10-CM | POA: Insufficient documentation

## 2018-01-25 LAB — COMPREHENSIVE METABOLIC PANEL
ALT: 10 U/L (ref 0–44)
AST: 20 U/L (ref 15–41)
Albumin: 3.6 g/dL (ref 3.5–5.0)
Alkaline Phosphatase: 65 U/L (ref 38–126)
Anion gap: 13 (ref 5–15)
BILIRUBIN TOTAL: 1 mg/dL (ref 0.3–1.2)
BUN: 20 mg/dL (ref 8–23)
CO2: 25 mmol/L (ref 22–32)
Calcium: 9.7 mg/dL (ref 8.9–10.3)
Chloride: 103 mmol/L (ref 98–111)
Creatinine, Ser: 0.81 mg/dL (ref 0.44–1.00)
GFR calc Af Amer: >60 mL/min (ref >60)
GFR calc non Af Amer: >60 mL/min (ref >60)
GLUCOSE: 104 mg/dL — AB (ref 70–99)
Potassium: 3.7 mmol/L (ref 3.5–5.1)
Sodium: 141 mmol/L (ref 135–145)
TOTAL PROTEIN: 6.8 g/dL (ref 6.5–8.1)

## 2018-01-25 LAB — CBC WITH DIFFERENTIAL/PLATELET
ABS IMMATURE GRANULOCYTES: 0.03 10*3/uL (ref 0.00–0.07)
Basophils Absolute: 0 10*3/uL (ref 0.0–0.1)
Basophils Relative: 1 %
Eosinophils Absolute: 0.2 10*3/uL (ref 0.0–0.5)
Eosinophils Relative: 3 %
HCT: 42.9 % (ref 36.0–46.0)
Hemoglobin: 13.5 g/dL (ref 12.0–15.0)
Immature Granulocytes: 0 %
Lymphocytes Relative: 17 %
Lymphs Abs: 1.4 10*3/uL (ref 0.7–4.0)
MCH: 28.2 pg (ref 26.0–34.0)
MCHC: 31.5 g/dL (ref 30.0–36.0)
MCV: 89.6 fL (ref 80.0–100.0)
MONOS PCT: 7 %
Monocytes Absolute: 0.6 10*3/uL (ref 0.1–1.0)
Neutro Abs: 6.1 10*3/uL (ref 1.7–7.7)
Neutrophils Relative %: 72 %
Platelets: 323 10*3/uL (ref 150–400)
RBC: 4.79 MIL/uL (ref 3.87–5.11)
RDW: 13.8 % (ref 11.5–15.5)
WBC: 8.3 10*3/uL (ref 4.0–10.5)
nRBC: 0 % (ref 0.0–0.2)

## 2018-01-25 LAB — I-STAT CHEM 8, ED
BUN: 22 mg/dL (ref 8–23)
Calcium, Ion: 1.26 mmol/L (ref 1.15–1.40)
Chloride: 105 mmol/L (ref 98–111)
Creatinine, Ser: 0.7 mg/dL (ref 0.44–1.00)
Glucose, Bld: 100 mg/dL — ABNORMAL HIGH (ref 70–99)
HCT: 42 % (ref 36.0–46.0)
Hemoglobin: 14.3 g/dL (ref 12.0–15.0)
Potassium: 3.7 mmol/L (ref 3.5–5.1)
Sodium: 141 mmol/L (ref 135–145)
TCO2: 25 mmol/L (ref 22–32)

## 2018-01-25 LAB — URINALYSIS, ROUTINE W REFLEX MICROSCOPIC
Bilirubin Urine: NEGATIVE
Glucose, UA: NEGATIVE mg/dL
Hgb urine dipstick: NEGATIVE
Ketones, ur: 5 mg/dL — AB
Leukocytes, UA: NEGATIVE
Nitrite: NEGATIVE
Protein, ur: NEGATIVE mg/dL
Specific Gravity, Urine: 1.015 (ref 1.005–1.030)
pH: 6 (ref 5.0–8.0)

## 2018-01-25 LAB — I-STAT VENOUS BLOOD GAS, ED
Acid-Base Excess: 2 mmol/L (ref 0.0–2.0)
Bicarbonate: 25.9 mmol/L (ref 20.0–28.0)
O2 Saturation: 93 %
TCO2: 27 mmol/L (ref 22–32)
pCO2, Ven: 36.2 mmHg — ABNORMAL LOW (ref 44.0–60.0)
pH, Ven: 7.462 — ABNORMAL HIGH (ref 7.250–7.430)
pO2, Ven: 61 mmHg — ABNORMAL HIGH (ref 32.0–45.0)

## 2018-01-25 LAB — TSH: TSH: 1.538 u[IU]/mL (ref 0.350–4.500)

## 2018-01-25 LAB — AMMONIA: Ammonia: <9 umol/L — ABNORMAL LOW (ref 9–35)

## 2018-01-25 LAB — I-STAT CG4 LACTIC ACID, ED: Lactic Acid, Venous: 0.88 mmol/L (ref 0.5–1.9)

## 2018-01-25 LAB — ETHANOL: Alcohol, Ethyl (B): <10 mg/dL (ref <10)

## 2018-01-25 MED ORDER — LEVOTHYROXINE SODIUM 75 MCG PO TABS: 75.0000 ug | ORAL_TABLET | Freq: Every day | ORAL | Status: DC

## 2018-01-25 MED ORDER — AMLODIPINE BESYLATE 5 MG PO TABS
5.0000 mg | ORAL_TABLET | Freq: Once | ORAL | Status: AC
  Administered 2018-01-25: 5 mg via ORAL
  Filled 2018-01-25: qty 1

## 2018-01-25 MED ORDER — SODIUM CHLORIDE 0.9 % IV BOLUS
1000.0000 mL | Freq: Once | INTRAVENOUS | Status: AC
  Administered 2018-01-25: 1000 mL via INTRAVENOUS

## 2018-01-25 MED ORDER — LEVOTHYROXINE SODIUM 75 MCG PO TABS
75.0000 ug | ORAL_TABLET | Freq: Every day | ORAL | Status: DC
  Administered 2018-01-25: 75 ug via ORAL
  Filled 2018-01-25: qty 1

## 2018-01-25 MED ORDER — SODIUM CHLORIDE 0.9 % IV BOLUS
500.0000 mL | Freq: Once | INTRAVENOUS | Status: AC
  Administered 2018-01-25: 500 mL via INTRAVENOUS

## 2018-01-25 MED ORDER — THIAMINE HCL 100 MG/ML IJ SOLN
100.0000 mg | Freq: Once | INTRAMUSCULAR | Status: AC
  Administered 2018-01-25: 100 mg via INTRAVENOUS
  Filled 2018-01-25: qty 2

## 2018-01-25 NOTE — ED Notes (Signed)
ED Provider at bedside. 

## 2018-01-25 NOTE — ED Notes (Signed)
[facility] notified of need for transportation home.

## 2018-01-25 NOTE — Discharge Instructions (Signed)
Please call your family doc in the morning.  Return for worsening symptoms or concerns.

## 2018-01-25 NOTE — ED Notes (Signed)
Discharge instructions discussed with Pt. Pt verbalized understanding. Pt stable and leaving via [facility].  

## 2018-01-25 NOTE — ED Triage Notes (Signed)
Per [facility], pt coming from home where she lives with 82 y/o husband. Per family for past 2-3 days pt has had decreased intake, not moving as much, not talking as much, last urination yesterday. Pt had a fall 9 days ago, on thinners, was seen here, sutures present to back of head. Pt at baseline can normally say her name sometimes/sometimes not oriented to self. Pt has hx of stroke and a fib. Pt a fib on monitor for EMS. Pt not in pain.

## 2018-01-25 NOTE — ED Notes (Signed)
Pharmacy notified of need for synthroid

## 2018-01-25 NOTE — ED Notes (Signed)
Patient transported to X-ray 

## 2018-01-25 NOTE — ED Provider Notes (Signed)
[facility] EMERGENCY DEPARTMENT Provider Note   CSN: [id number] Arrival date & time: 01/25/18  1534     History   Chief Complaint Chief Complaint  Patient presents with  . Failure To Thrive    HPI [name] [name] is a 84 y.o. female.  84 yo F with chief complaint of altered mental status.  Having a decline over the past 3 to 4 days not eating or drinking.  Had a fall about a week ago.  Struck her head at that time had a CT that was reportedly negative and was discharged home.  Patient is able to respond, states that nothing is hurting her.  Denies any complaints.  The history is provided by the patient.  Illness  This is a new problem. The current episode started yesterday. The problem occurs constantly. The problem has not changed since onset.Pertinent negatives include no chest pain, no headaches and no shortness of breath. Nothing aggravates the symptoms. Nothing relieves the symptoms. She has tried nothing for the symptoms. The treatment provided no relief.    Past Medical History:  Diagnosis Date  . Anemia   . Anxiety   . Anxiety   . Atrial fibrillation ([place])   . Cerebrovascular accident ([place])   . Diverticula of colon   . H/O: hysterectomy   . Hyperlipidemia   . Hypertension   . Iron deficiency   . Malignant neoplasm of breast [facility])    personal history of  . Myocardial infarction [facility])     Patient Active Problem List   Diagnosis Date Noted  . Hypothyroidism 03/19/2016  . Parkinson's disease ([place]) 11/13/2015  . Atrial fibrillation with RVR ([place]) 03/31/2015  . Syncope and collapse 03/31/2015  . Tremor 03/31/2015  . UTI (lower urinary tract infection) 03/31/2015  . Syncope 03/31/2015  . Anxiety   . Macular degeneration 05/14/2011  . Hyperlipidemia 05/14/2011  . Dementia ([place]) 05/14/2011  . Allergic rhinitis 05/14/2011  . Anticoagulant long-term use 08/01/2010  . ANEMIA 02/09/2007  . ANXIETY 02/09/2007  . Essential hypertension  02/09/2007  . H/O PAF- CHADs VASc= 6 02/09/2007  . History of CVA (cerebrovascular accident) 02/09/2007  . PERSONAL HISTORY OF MALIGNANT NEOPLASM OF BREAST 02/09/2007  . COLONIC POLYPS, ADENOMATOUS 09/02/2006  . DIVERTICULOSIS OF COLON 11/29/2002    Past Surgical History:  Procedure Laterality Date  . CHOLECYSTECTOMY    . EYE SURGERY     bil-cataract  . MASTECTOMY     left breast  . TOTAL KNEE ARTHROPLASTY       OB History   No obstetric history on file.      Home Medications    Prior to Admission medications   Medication Sig Start Date End Date Taking? Authorizing Provider  acetaminophen (TYLENOL) 325 MG tablet Take 2 tablets (650 mg total) by mouth every 4 (four) hours as needed for headache or mild pain. 04/01/15  Yes Kilroy, Luke K, PA-C  ALPRAZolam (XANAX) 0.25 MG tablet TAKE 1 TABLET BY MOUTH 3 TIMES A DAY AS NEEDED FOR ANXIETY Patient taking differently: Take 0.25 mg by mouth 3 (three) times daily as needed for anxiety.  10/01/17  Yes Baxley, Cresenciano Lick, MD  carbidopa-levodopa (SINEMET IR) 25-100 MG tablet TAKE 1/2 TABLET BY MOUTH TWICE A DAY WITH FOOD Patient taking differently: Take 0.5 tablets by mouth 2 (two) times daily.  05/09/17  Yes Baxley, Cresenciano Lick, MD  docusate sodium (COLACE) 100 MG capsule Take 100 mg by mouth 2 (two) times a week. Tuesday  and Friday   Yes [provider]  ELIQUIS 2.5 MG TABS tablet TAKE 1 TABLET BY MOUTH TWICE A DAY Patient taking differently: Take 2.5 mg by mouth 2 (two) times daily.  09/22/17  Yes [place], Peter M, MD  KLOR-CON M20 20 MEQ tablet TAKE 1 TABLET BY MOUTH EVERY DAY Patient taking differently: Take 20 mEq by mouth daily.  01/05/18  Yes [place], Peter M, MD  levothyroxine (SYNTHROID, LEVOTHROID) 75 MCG tablet Take 75 mcg by mouth daily before breakfast.   Yes [provider]  losartan (COZAAR) 50 MG tablet TAKE 1 TABLET BY MOUTH EVERY DAY Patient taking differently: Take 25 mg by mouth daily.  12/22/17  Yes [place], Peter  M, MD  multivitamin-iron-minerals-folic acid (CENTRUM) chewable tablet Chew 1 tablet by mouth daily.     Yes [provider]  NORVASC 5 MG tablet TAKE 1 TABLET BY MOUTH EVERYDAY AT BEDTIME Patient taking differently: Take 5 mg by mouth daily.  11/10/17  Yes [place], Peter M, MD  OLANZapine (ZYPREXA) 5 MG tablet TAKE 1 TABLET BY MUTH AT BEDTIME Patient taking differently: Take 5 mg by mouth at bedtime.  05/09/17  Yes Baxley, Cresenciano Lick, MD  TIKOSYN 250 MCG capsule TAKE 1 CAPSULE BY MOUTH 2 TIMES DAILY Patient taking differently: Take 250 mcg by mouth 2 (two) times daily.  01/21/18  Yes [place], Peter M, MD  SYNTHROID 50 MCG tablet TAKE 1 TABLET BY MOUTH EVERY DAY Patient not taking: Reported on 01/25/2018 06/05/17   [name], MD    Family History Family History  Problem Relation Age of Onset  . Heart disease Mother   . Hypertension Mother   . Healthy Father 43       neck fracture  . Cancer Brother        prostate  . Coronary artery disease Other        family history of     Social History Social History   Tobacco Use  . Smoking status: Never Smoker  . Smokeless tobacco: Never Used  Substance Use Topics  . Alcohol use: No  . Drug use: No     Allergies   Escitalopram oxalate; Codeine; and Phenytoin sodium extended   Review of Systems Review of Systems  Unable to perform ROS: Dementia  Constitutional: Negative for chills and fever.  HENT: Negative for congestion and rhinorrhea.   Eyes: Negative for redness and visual disturbance.  Respiratory: Negative for shortness of breath and wheezing.   Cardiovascular: Negative for chest pain and palpitations.  Gastrointestinal: Negative for nausea and vomiting.  Genitourinary: Negative for dysuria and urgency.  Musculoskeletal: Negative for arthralgias and myalgias.  Skin: Negative for pallor and wound.  Neurological: Negative for dizziness and headaches.     Physical Exam Updated Vital Signs BP (!) 191/105   Pulse  91   Temp 98.1 F (36.7 C) (Oral)   Resp 16   SpO2 96%   Physical Exam Vitals signs and nursing note reviewed.  Constitutional:      General: She is not in acute distress.    Appearance: She is well-developed. She is not diaphoretic.  HENT:     Head: Normocephalic and atraumatic.     Mouth/Throat:     Mouth: Mucous membranes are dry.  Eyes:     Pupils: Pupils are equal, round, and reactive to light.  Neck:     Musculoskeletal: Normal range of motion and neck supple.  Cardiovascular:     Rate and Rhythm:  Normal rate and regular rhythm.     Heart sounds: No murmur. No friction rub. No gallop.   Pulmonary:     Effort: Pulmonary effort is normal.     Breath sounds: No wheezing or rales.  Abdominal:     General: There is no distension.     Palpations: Abdomen is soft.     Tenderness: There is no abdominal tenderness.  Musculoskeletal:        General: No tenderness.  Skin:    General: Skin is warm and dry.  Neurological:     Mental Status: She is alert and oriented to person, place, and time.  Psychiatric:        Behavior: Behavior normal.      ED Treatments / Results  Labs (all labs ordered are listed, but only abnormal results are displayed) Labs Reviewed  AMMONIA - Abnormal; Notable for the following components:      Result Value   Ammonia <9 (*)    All other components within normal limits  COMPREHENSIVE METABOLIC PANEL - Abnormal; Notable for the following components:   Glucose, Bld 104 (*)    All other components within normal limits  URINALYSIS, ROUTINE W REFLEX MICROSCOPIC - Abnormal; Notable for the following components:   Ketones, ur 5 (*)    All other components within normal limits  I-STAT CHEM 8, ED - Abnormal; Notable for the following components:   Glucose, Bld 100 (*)    All other components within normal limits  I-STAT VENOUS BLOOD GAS, ED - Abnormal; Notable for the following components:   pH, Ven 7.462 (*)    pCO2, Ven 36.2 (*)    pO2, Ven  61.0 (*)    All other components within normal limits  URINE CULTURE  ETHANOL  CBC WITH DIFFERENTIAL/PLATELET  TSH  T4  I-STAT CG4 LACTIC ACID, ED    EKG EKG Interpretation  Date/Time:  Sunday January 25 2018 15:45:32 EST Ventricular Rate:  76 PR Interval:    QRS Duration: 93 QT Interval:  403 QTC Calculation: 454 R Axis:   -17 Text Interpretation:  Sinus rhythm Atrial premature complex Borderline left axis deviation No significant change since last tracing Confirmed by Deno Etienne [phone]) on 01/25/2018 4:11:16 PM   Radiology Dg Chest 2 View  Result Date: 01/25/2018 CLINICAL DATA:  Altered level of consciousness EXAM: CHEST - 2 VIEW COMPARISON:  04/19/2015 FINDINGS: Heart is normal size. Mediastinal contours within normal limits. No confluent airspace opacities or effusions. No acute bony abnormality. Mild hyperinflation. IMPRESSION: No active cardiopulmonary disease. Electronically Signed   By: Rolm Baptise M.D.   On: 01/25/2018 16:50   Ct Head Wo Contrast  Result Date: 01/25/2018 CLINICAL DATA:  Status post fall 9 days ago. Decreased p.o. intake over the past 2 days. EXAM: CT HEAD WITHOUT CONTRAST TECHNIQUE: Contiguous axial images were obtained from the base of the skull through the vertex without intravenous contrast. COMPARISON:  Head CT scan 01/16/2018 and 03/20/2010. FINDINGS: Brain: No evidence of acute infarction, hemorrhage, hydrocephalus, extra-axial collection or mass lesion/mass effect. Vascular: No hyperdense vessel or unexpected calcification. Skull: Intact.  No worrisome lesion. Sinuses/Orbits: Negative. Other: Staples for scalp laceration on the right noted. IMPRESSION: Staples for scalp laceration on the right. The exam is otherwise negative. Electronically Signed   By: Inge Rise M.D.   On: 01/25/2018 17:31    Procedures .Suture Removal Date/Time: 01/25/2018 6:32 PM Performed by: Deno Etienne, DO Authorized by: Deno Etienne, DO   Consent:  Consent  obtained:  Verbal   Consent given by:  Patient and guardian   Risks discussed:  Bleeding, pain and wound separation   Alternatives discussed:  No treatment, delayed treatment, alternative treatment and observation Location:    Location:  Head/neck   Head/neck location:  Scalp Procedure details:    Wound appearance:  No signs of infection, good wound healing, nonpurulent, nontender and clean   Number of staples removed:  4 Post-procedure details:    Post-removal:  No dressing applied   Patient tolerance of procedure:  Tolerated well, no immediate complications   (including critical care time)  Medications Ordered in ED Medications  levothyroxine (SYNTHROID, LEVOTHROID) tablet 75 mcg (75 mcg Oral Given 01/25/18 1952)  amLODipine (NORVASC) tablet 5 mg (has no administration in time range)  sodium chloride 0.9 % bolus 1,000 mL (0 mLs Intravenous Stopped 01/25/18 1817)  sodium chloride 0.9 % bolus 500 mL (0 mLs Intravenous Stopped 01/25/18 1932)  thiamine (B-1) injection 100 mg (100 mg Intravenous Given 01/25/18 1935)     Initial Impression / Assessment and Plan / ED Course  I have reviewed the triage vital signs and the nursing notes.  Pertinent labs & imaging results that were available during my care of the patient were reviewed by me and considered in my medical decision making (see chart for details).  Clinical Course as of Jan 26 2003  Sun Jan 25, 2018  1817 Labwork largely unremarkable.  UA negative for infection, ammonia low, renal function at baseline.  Not hypercarbic, not anemic CT head without ICH as viewed by me, cxr without focal infiltrate as viewed by me   [DF]    Clinical Course User Index [DF] Deno Etienne, DO    84 yo F with a chief complaint of altered mental status.  On my exam the patient is able to wake to voice and to respond to some direct statements.  Will obtain an altered mental status work-up.  She appears dehydrated will give a bolus of fluids.   Reassess.  Patient has significant improvement of her mental status post IV fluids.  Her TSH is normal.  Other work-up was unremarkable as well.  I discussed this with the family.  She was able to swallow at bedside for [place].  I attempted to have case management evaluate the patient but they had left for the day.  I entered home health face-to-face orders in case that is what is decided upon with case management.  We will discharge the patient home.  8:04 PM:  I have discussed the diagnosis/risks/treatment options with the patient and family and believe the pt to be eligible for discharge home to follow-up with PCP. We also discussed returning to the ED immediately if new or worsening sx occur. We discussed the sx which are most concerning (e.g., sudden worsening pain, fever, inability to tolerate by mouth) that necessitate immediate return. Medications administered to the patient during their visit and any new prescriptions provided to the patient are listed below.  Medications given during this visit Medications  levothyroxine (SYNTHROID, LEVOTHROID) tablet 75 mcg (75 mcg Oral Given 01/25/18 1952)  amLODipine (NORVASC) tablet 5 mg (has no administration in time range)  sodium chloride 0.9 % bolus 1,000 mL (0 mLs Intravenous Stopped 01/25/18 1817)  sodium chloride 0.9 % bolus 500 mL (0 mLs Intravenous Stopped 01/25/18 1932)  thiamine (B-1) injection 100 mg (100 mg Intravenous Given 01/25/18 1935)      The patient appears reasonably screen and/or stabilized  for discharge and I doubt any other medical condition or other [facility] requiring further screening, evaluation, or treatment in the ED at this time prior to discharge.    Final Clinical Impressions(s) / ED Diagnoses   Final diagnoses:  Dehydration  Failure to thrive in adult    ED Discharge Orders         [place]     01/25/18 2002    Face-to-face encounter (required for Medicare/Medicaid patients)    Comments:  I Cecilio Asper certify that this patient is under my care and that I, or a nurse practitioner or physician's assistant working with me, had a face-to-face encounter that meets the physician face-to-face encounter requirements with this patient on 01/25/2018. The encounter with the patient was in whole, or in part for the following medical condition(s) which is the primary reason for home health care (List medical condition): Increasing dementia and difficulty with swallowing, requiring 24-hour care at home by a 84 year old husband.   01/25/18 2002           Deno Etienne, DO 01/25/18 2004

## 2018-01-26 LAB — URINE CULTURE: Culture: NO GROWTH

## 2018-01-26 LAB — T4: T4, Total: 11 ug/dL (ref 4.5–12.0)

## 2018-01-27 ENCOUNTER — Encounter: Payer: Self-pay | Admitting: Internal Medicine

## 2018-01-27 ENCOUNTER — Ambulatory Visit (INDEPENDENT_AMBULATORY_CARE_PROVIDER_SITE_OTHER): Payer: Medicare Other | Admitting: Internal Medicine

## 2018-01-27 DIAGNOSIS — F0390 Unspecified dementia without behavioral disturbance: Secondary | ICD-10-CM | POA: Diagnosis not present

## 2018-01-27 DIAGNOSIS — W19XXXS Unspecified fall, sequela: Secondary | ICD-10-CM | POA: Diagnosis not present

## 2018-01-27 DIAGNOSIS — F411 Generalized anxiety disorder: Secondary | ICD-10-CM

## 2018-01-27 DIAGNOSIS — G20 Parkinson's disease: Secondary | ICD-10-CM

## 2018-02-03 NOTE — Telephone Encounter (Signed)
Patient info and referral received by [facility]osp[facility]. Confirming that I will be attending of record. They plan to go out today to see pt.

## 2018-02-07 ENCOUNTER — Encounter: Payer: Self-pay | Admitting: Internal Medicine

## 2018-02-07 NOTE — Progress Notes (Signed)
   Subjective:    Patient ID: [name] [name], female    DOB: [birth year], 84 y.o.   MRN: [id number]  HPI 84 year old Female with history of dementia and Parkinson's disease coming more difficult to care for at home.  She resides with her husband and has help from son and daughter-in-law.  Really does not like to leave the house anymore.  Has had decreased appetite.  Increasing confusion.  Increasing agitation.  Family is exacerbated with the fact that she does not want to eat.  They have tried various foods and liquids but sometimes she simply will not eat.  We discussed possibility of an appetite stimulant but I think at this point they are ready to consider nursing home placement.  She is not present today but her son and daughter-in-law are here for consultation.  I have spent 30 minutes speaking with son and daughter-in-law regarding patient's condition and management.  They have stopped her Sinemet.  They have continued Zyprexa for agitation.  They are receptive to end-of-life care from Hospice.  A referral will be placed today.  She fell on December 6 and suffered a fall and scalp laceration.  Was seen in the emergency department and laceration was repaired.  CT of the brain showed mild chronic ischemia and white matter disease with diffuse cortical atrophy.  Was taken back to the emergency department on December 15 with failure to thrive and volume depletion.  Had not been eating or drinking for 3 to 4 days.  Was given IV fluids.  Chemistries were stable.  Lactic acid level was normal.  Thyroid functions were normal.  CBC was normal.  They do not feel like the patient is in any pain at this point in time.  Review of Systems see above     Objective:   Physical Exam  Patient was not seen today nor examined.  This was a family consultation      Assessment & Plan:  End-of-life discussion/Hospice care discussed.  Son is willing to consider hospice consultation.  Paperwork will be completed  today for referral to hospice.  Spent 30 minutes speaking with patient and daughter-in-law.  They will speak with patient's husband regarding this referral.

## 2018-02-07 NOTE — Patient Instructions (Signed)
Referral made to Hospice at [facility]

## 2018-02-26 ENCOUNTER — Telehealth: Payer: Self-pay

## 2018-02-26 ENCOUNTER — Other Ambulatory Visit: Payer: Self-pay | Admitting: Cardiology

## 2018-02-26 NOTE — Telephone Encounter (Signed)
Received call from patient's daughter n law Manus Gunning requesting Tikosyn refill.Advised Tikosyn refill already sent to pharmacy.

## 2018-02-26 NOTE — Telephone Encounter (Signed)
Rx request sent to pharmacy.  

## 2018-03-09 ENCOUNTER — Telehealth: Payer: Self-pay | Admitting: Cardiology

## 2018-03-09 MED ORDER — DOFETILIDE 250 MCG PO CAPS: 250.0000 ug | ORAL_CAPSULE | Freq: Two times a day (BID) | ORAL | 3 refills | Status: DC

## 2018-03-09 NOTE — Telephone Encounter (Signed)
Follow up    Per the previous message The patient's son is returning the call. Please contact him about additional information about the patient's prescription.

## 2018-03-09 NOTE — Telephone Encounter (Signed)
Follow up    Patient is returning your call. Patient has additional information about a prescription.

## 2018-03-09 NOTE — Telephone Encounter (Signed)
New Message   Pt is returning phone call for [facility]  Please call

## 2018-03-09 NOTE — Telephone Encounter (Signed)
Agree with [name] [name]. No other options. She needs to stay on Tikosyn. No other options for treating her Afib.    Chrsitopher Wik Martinique MD, [facility]

## 2018-03-09 NOTE — Telephone Encounter (Signed)
Louie Casa called back to say that patient's current pharmacy [facility]. Uses  goodrx saving card  he request prescription be sent this pharmacy. RN e-sent  Dofetilide to [facility].  RN called and cancelled rx at [facility] per  [facility] request.

## 2018-03-09 NOTE — Telephone Encounter (Signed)
We don't have any other products made by [employer] that would be similar.  Unfortunately we don't have many options, but the best we've found is to use GoodRx.  They have a coupon that is good for $36.96 for 30 days or 107.84 for 90 days at [employer].  If they want to switch medications, they are going to need to see MD or APP.

## 2018-03-09 NOTE — Telephone Encounter (Signed)
SPOKE WITH SON . INFORMATION GIVEN .  NOTIFIED SON THAT GENERIC MEDICATION IS AVAILABLE AT THE PHARMACY -HE CHOSEN- [facility] AT [place].  90 SUPPLY E-SENT AS REQUESTED.  SON IS AWARE, HE WOULD HAVE TO GO ONLINE AN DOWNLOAD OR COME BY OFFICE TO PICK  RX DRUG SAVINGS CARD.  SON STATES HE CALL IF NEEDED THE SAVINGS CARD.

## 2018-03-09 NOTE — Telephone Encounter (Signed)
Spoke with son-  He states  He spoke to  [employer] /or [place] medication will not be available to the middle of  2021.  Son wanted to know if there is an alternative that is in [employer] line up  (because medication is free for patient) or something that is on a lower formulary tier. Son states  She has  @ 2 weeks worth of generic tikosyn  son is aware will defer to Dr Martinique and pharmacist

## 2018-03-10 ENCOUNTER — Encounter: Payer: Self-pay | Admitting: Internal Medicine

## 2018-03-10 ENCOUNTER — Telehealth: Payer: Self-pay | Admitting: Internal Medicine

## 2018-03-10 NOTE — Telephone Encounter (Signed)
Returned call to patient's son [name] [name].He stated he was told by [facility] RN that in patient's formulary they are 23 other drugs similar to Tikosyn.Advised I am not aware.I will speak to our pharmacist.He also stated he would like to ask Dr.Jordan if ok for mother to stop Tikosyn all together.Stated even generic copay too expensive.Stated he was also told brand name Phyllis Ginger is not being made until after 2021.Advised I will speak to Dr.Jordan tomorrow when he is back in office.

## 2018-03-10 NOTE — Telephone Encounter (Signed)
Spoke with son Ammanda Dobbins. Pt has Hospice services at  and remains at home. Son is pleased with Hospice services. Takes meds most days. Drinks about 8 oz water daily and eats one meals. Mostly in bed.

## 2018-03-10 NOTE — Telephone Encounter (Signed)
Patient's son Louie Casa would like a call back about the medication dofetilide (TIKOSYN).

## 2018-03-11 MED ORDER — DOFETILIDE 250 MCG PO CAPS: 250.0000 ug | ORAL_CAPSULE | Freq: Two times a day (BID) | ORAL | 11 refills | Status: AC

## 2018-03-11 NOTE — Telephone Encounter (Signed)
Spoke to patient's son Louie Casa.Dr.Jordan advised patient needs to continue Tikosyn.Advised I will send in a 30 day prescription to [place].Advised to take Good Rx savings card to get a lower price.

## 2018-03-11 NOTE — Addendum Note (Signed)
Addended by: Kathyrn Lass on: 03/11/2018 04:51 PM   Modules accepted: Orders

## 2018-03-23 ENCOUNTER — Telehealth: Payer: Self-pay

## 2018-03-23 ENCOUNTER — Telehealth: Payer: Self-pay | Admitting: Internal Medicine

## 2018-03-24 NOTE — Telephone Encounter (Signed)
Dr. Martinique,  Dr. Renold Genta wanted me to send this to you as an FYI.

## 2018-03-30 DIAGNOSIS — G20 Parkinson's disease: Secondary | ICD-10-CM

## 2018-03-30 DIAGNOSIS — F0280 Dementia in other diseases classified elsewhere without behavioral disturbance: Secondary | ICD-10-CM

## 2018-03-30 DIAGNOSIS — E860 Dehydration: Secondary | ICD-10-CM

## 2018-04-12 NOTE — Telephone Encounter (Signed)
Received a phone call from Hospice that patient expired this afternoon at 1:01 p.m.  They will send a final report over for our records to put in chart.

## 2018-04-12 NOTE — Telephone Encounter (Signed)
Tonya from Hospice called to let us know that patient passed away today at her home time of death 1:01pm. She will be faxing death certificate.

## 2018-04-12 DEATH — deceased
